# Patient Record
Sex: Male | Born: 1955
Health system: Southern US, Community
[De-identification: ages and names within clinical notes are randomized; demographics above are authoritative.]

## PROBLEM LIST (undated history)

## (undated) DIAGNOSIS — R011 Cardiac murmur, unspecified: Secondary | ICD-10-CM

## (undated) DIAGNOSIS — E785 Hyperlipidemia, unspecified: Secondary | ICD-10-CM

## (undated) DIAGNOSIS — M1990 Unspecified osteoarthritis, unspecified site: Secondary | ICD-10-CM

## (undated) DIAGNOSIS — I10 Essential (primary) hypertension: Secondary | ICD-10-CM

## (undated) HISTORY — PX: KNEE ARTHROSCOPY W/ MENISCAL REPAIR: SHX1877

## (undated) HISTORY — DX: Cardiac murmur, unspecified: R01.1

## (undated) HISTORY — DX: Unspecified osteoarthritis, unspecified site: M19.90

## (undated) HISTORY — DX: Hyperlipidemia, unspecified: E78.5

## (undated) HISTORY — DX: Essential (primary) hypertension: I10

## (undated) HISTORY — PX: COLONOSCOPY: SHX174

---

## 2013-02-09 ENCOUNTER — Ambulatory Visit (INDEPENDENT_AMBULATORY_CARE_PROVIDER_SITE_OTHER): Payer: 59 | Admitting: Family Medicine

## 2013-02-09 ENCOUNTER — Ambulatory Visit: Payer: 59

## 2013-02-09 VITALS — BP 145/89 | HR 67 | Temp 97.9°F | Resp 16 | Ht 72.0 in | Wt 177.0 lb

## 2013-02-09 DIAGNOSIS — G8929 Other chronic pain: Secondary | ICD-10-CM

## 2013-02-09 DIAGNOSIS — M1610 Unilateral primary osteoarthritis, unspecified hip: Secondary | ICD-10-CM

## 2013-02-09 DIAGNOSIS — M169 Osteoarthritis of hip, unspecified: Secondary | ICD-10-CM

## 2013-02-09 DIAGNOSIS — M25559 Pain in unspecified hip: Secondary | ICD-10-CM

## 2013-02-09 MED ORDER — NABUMETONE 750 MG PO TABS: 750.0000 mg | ORAL_TABLET | Freq: Two times a day (BID) | ORAL | Status: DC

## 2013-02-09 NOTE — Patient Instructions (Addendum)
Referral will be made to the orthopedist.  Take Relafen one twice daily
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
## 2013-02-17 ENCOUNTER — Telehealth: Payer: Self-pay

## 2013-02-17 NOTE — Telephone Encounter (Signed)
Dr Alwyn Ren had received a request for surgical clearance from [facility] and asked me to contact pt to advise him to RTC for eval. I called pt and he asked me to speak to wife. Advised wife of need to RTC to eval for safety of undergoing surgery and gave her Dr Hopper's hours. She stated they had attempted to come today but got here only an hour before he left and he was too busy to see pt before hours were up. She agreed that they will RTC again.

## 2013-03-10 ENCOUNTER — Telehealth: Payer: Self-pay

## 2013-03-10 NOTE — Telephone Encounter (Signed)
Patient dropped off medical release form yesterday and was told it would be done within 24 hours.  He is upset as it is not done yet.   CBN:  [phone]

## 2013-03-22 ENCOUNTER — Encounter (HOSPITAL_COMMUNITY): Admission: RE | Payer: Self-pay | Source: Ambulatory Visit

## 2013-03-22 ENCOUNTER — Inpatient Hospital Stay (HOSPITAL_COMMUNITY): Admission: RE | Admit: 2013-03-22 | Payer: Self-pay | Source: Ambulatory Visit | Admitting: Orthopedic Surgery

## 2013-03-22 SURGERY — ARTHROPLASTY, HIP, TOTAL,POSTERIOR APPROACH
Anesthesia: General | Laterality: Right

## 2014-11-20 ENCOUNTER — Ambulatory Visit (INDEPENDENT_AMBULATORY_CARE_PROVIDER_SITE_OTHER): Payer: 59 | Admitting: Internal Medicine

## 2014-11-20 VITALS — BP 159/100 | HR 61 | Temp 98.2°F | Resp 18 | Wt 179.0 lb

## 2014-11-20 DIAGNOSIS — H9201 Otalgia, right ear: Secondary | ICD-10-CM

## 2014-11-20 DIAGNOSIS — J3489 Other specified disorders of nose and nasal sinuses: Secondary | ICD-10-CM

## 2014-11-20 DIAGNOSIS — R0989 Other specified symptoms and signs involving the circulatory and respiratory systems: Secondary | ICD-10-CM

## 2014-11-20 MED ORDER — NEOMYCIN-POLYMYXIN-HC 3.5-10000-1 OT SOLN: 4.0000 [drp] | Freq: Three times a day (TID) | OTIC | Status: DC

## 2014-11-20 NOTE — Progress Notes (Signed)
   Subjective:    Patient ID: Travis Trevino, male    DOB: 1956-09-29, 58 y.o.   MRN: [id number]  HPI Painful right ear Minor nasal congestion, no fever, no sweats.   Review of Systems     Objective:   Physical Exam  Constitutional: He is oriented to person, place, and time. He appears well-developed and well-nourished. No distress.  HENT:  Head: Normocephalic.  Right Ear: External ear normal.  Left Ear: External ear normal.  Nose: Nose normal.  Mouth/Throat: Oropharynx is clear and moist.  Eyes: Conjunctivae and EOM are normal. Pupils are equal, round, and reactive to light.  Neck: Normal range of motion. Neck supple.  Musculoskeletal: Normal range of motion.  Lymphadenopathy:       Head (right side): Posterior auricular adenopathy present.    He has cervical adenopathy.       Right cervical: Superficial cervical adenopathy present.  Neurological: He is alert and oriented to person, place, and time. He exhibits normal muscle tone. Coordination normal.  Skin: No rash noted.  Psychiatric: He has a normal mood and affect.  Vitals reviewed.         Assessment & Plan:  Otitis externa

## 2014-11-20 NOTE — Patient Instructions (Signed)

## 2014-12-02 ENCOUNTER — Telehealth: Payer: Self-pay

## 2014-12-02 NOTE — Telephone Encounter (Signed)
Pt was prescribed cortisporin and is now experiencing green drainage. They would like an antibiotic for a sinus infection.

## 2014-12-04 NOTE — Telephone Encounter (Signed)
No answer on home phone- Pt needs to RTC

## 2014-12-05 NOTE — Telephone Encounter (Signed)
Lm on cell to RTC

## 2015-07-28 ENCOUNTER — Ambulatory Visit (INDEPENDENT_AMBULATORY_CARE_PROVIDER_SITE_OTHER): Payer: 59 | Admitting: Emergency Medicine

## 2015-07-28 VITALS — BP 130/84 | HR 71 | Temp 98.2°F | Resp 16 | Ht 71.5 in | Wt 175.6 lb

## 2015-07-28 DIAGNOSIS — L237 Allergic contact dermatitis due to plants, except food: Secondary | ICD-10-CM

## 2015-07-28 DIAGNOSIS — L03119 Cellulitis of unspecified part of limb: Secondary | ICD-10-CM | POA: Diagnosis not present

## 2015-07-28 MED ORDER — HYDROXYZINE HCL 25 MG PO TABS: 25.0000 mg | ORAL_TABLET | Freq: Four times a day (QID) | ORAL | Status: DC | PRN

## 2015-07-28 MED ORDER — SULFAMETHOXAZOLE-TRIMETHOPRIM 800-160 MG PO TABS: 1.0000 | ORAL_TABLET | Freq: Two times a day (BID) | ORAL | Status: DC

## 2015-07-28 MED ORDER — TRIAMCINOLONE ACETONIDE 0.1 % EX CREA: 1.0000 "application " | TOPICAL_CREAM | Freq: Two times a day (BID) | CUTANEOUS | Status: DC

## 2015-07-28 NOTE — Patient Instructions (Signed)

## 2015-07-28 NOTE — Progress Notes (Signed)
Subjective:  Patient ID: Travis Trevino, male    DOB: 05-28-1956  Age: 59 y.o. MRN: [id number]  CC: Rash   HPI HAROUN COTHAM presents  with a rash on both arms. Worse on the left. He said this rash developed after lifting some logs at work. He said no improvement in the itching with Benadryl he said that it keeps him awake at night he is concerned that he might have cellulitis involving left arm. He has no fever chills no drainage from the lesions he's had no improvement with over-the-counter medication History Nain has a past medical history of Arthritis and Heart murmur.   He has no past surgical history on file.   His  family history includes Cancer in his mother; Heart disease in his father.  He   reports that he has been smoking.  He does not have any smokeless tobacco history on file. He reports that he drinks alcohol. His drug history is not on file.  Outpatient Prescriptions Prior to Visit  Medication Sig Dispense Refill  . neomycin-polymyxin-hydrocortisone (CORTISPORIN) otic solution Place 4 drops into both ears 3 (three) times daily. 10 mL 3   No facility-administered medications prior to visit.    Social History   Social History  . Marital Status: Married    Spouse Name: N/A  . Number of Children: N/A  . Years of Education: N/A   Social History Main Topics  . Smoking status: Current Some Day Smoker  . Smokeless tobacco: None  . Alcohol Use: Yes     Comment: social  . Drug Use: None  . Sexual Activity: Not Asked   Other Topics Concern  . None   Social History Narrative     Review of Systems  Constitutional: Negative for fever, chills and appetite change.  HENT: Negative for congestion, ear pain, postnasal drip, sinus pressure and sore throat.   Eyes: Negative for pain and redness.  Respiratory: Negative for cough, shortness of breath and wheezing.   Cardiovascular: Negative for leg swelling.  Gastrointestinal: Negative for nausea, vomiting,  abdominal pain, diarrhea, constipation and blood in stool.  Endocrine: Negative for polyuria.  Genitourinary: Negative for dysuria, urgency, frequency and flank pain.  Musculoskeletal: Negative for gait problem.  Skin: Negative for rash.  Neurological: Negative for weakness and headaches.  Psychiatric/Behavioral: Negative for confusion and decreased concentration. The patient is not nervous/anxious.     Objective:  BP 130/84 mmHg  Pulse 71  Temp(Src) 98.2 F (36.8 C) (Oral)  Resp 16  Ht 5' 11.5" (1.816 m)  Wt 175 lb 9.6 oz (79.652 kg)  BMI 24.15 kg/m2  SpO2 98%  Physical Exam  Constitutional: He is oriented to person, place, and time. He appears well-developed and well-nourished.  HENT:  Head: Normocephalic and atraumatic.  Eyes: Conjunctivae are normal. Pupils are equal, round, and reactive to light.  Pulmonary/Chest: Effort normal.  Musculoskeletal: He exhibits no edema.  Neurological: He is alert and oriented to person, place, and time.  Skin: Skin is dry. Rash noted.  Psychiatric: He has a normal mood and affect. His behavior is normal. Thought content normal.   History generalized rash on his left forearm and less on his right forearm with scattered lesions on his lower extremities. He has swelling and redness in and warmth in the left arm suggestive of cellulitis but certainly not clear-cut   Assessment & Plan:   Nicholos was seen today for rash.  Diagnoses and all orders for this visit:  Poison  ivy  Cellulitis of upper extremity, unspecified laterality  Other orders -     triamcinolone cream (KENALOG) 0.1 %; Apply 1 application topically 2 (two) times daily. -     sulfamethoxazole-trimethoprim (BACTRIM DS,SEPTRA DS) 800-160 MG per tablet; Take 1 tablet by mouth 2 (two) times daily. -     hydrOXYzine (ATARAX/VISTARIL) 25 MG tablet; Take 1 tablet (25 mg total) by mouth every 6 (six) hours as needed for itching.   I have discontinued Mr. Shrewsberry  neomycin-polymyxin-hydrocortisone. I am also having him start on triamcinolone cream, sulfamethoxazole-trimethoprim, and hydrOXYzine.  Meds ordered this encounter  Medications  . triamcinolone cream (KENALOG) 0.1 %    Sig: Apply 1 application topically 2 (two) times daily.    Dispense:  30 g    Refill:  0  . sulfamethoxazole-trimethoprim (BACTRIM DS,SEPTRA DS) 800-160 MG per tablet    Sig: Take 1 tablet by mouth 2 (two) times daily.    Dispense:  20 tablet    Refill:  0  . hydrOXYzine (ATARAX/VISTARIL) 25 MG tablet    Sig: Take 1 tablet (25 mg total) by mouth every 6 (six) hours as needed for itching.    Dispense:  30 tablet    Refill:  0    Appropriate red flag conditions were discussed with the patient as well as actions that should be taken.  Patient expressed his understanding.  Follow-up: Return if symptoms worsen or fail to improve.  Carmelina Dane, MD

## 2015-09-08 ENCOUNTER — Ambulatory Visit (INDEPENDENT_AMBULATORY_CARE_PROVIDER_SITE_OTHER): Payer: 59 | Admitting: Emergency Medicine

## 2015-09-08 VITALS — BP 140/96 | HR 75 | Temp 97.9°F | Resp 17 | Ht 71.0 in | Wt 179.0 lb

## 2015-09-08 DIAGNOSIS — G4700 Insomnia, unspecified: Secondary | ICD-10-CM

## 2015-09-08 MED ORDER — TEMAZEPAM 30 MG PO CAPS: 30.0000 mg | ORAL_CAPSULE | Freq: Every evening | ORAL | Status: DC | PRN

## 2015-09-08 NOTE — Patient Instructions (Signed)
Insomnia Insomnia is frequent trouble falling and/or staying asleep. Insomnia can be a long term problem or a short term problem. Both are common. Insomnia can be a short term problem when the wakefulness is related to a certain stress or worry. Long term insomnia is often related to ongoing stress during waking hours and/or poor sleeping habits. Overtime, sleep deprivation itself can make the problem worse. Every little thing feels more severe because you are overtired and your ability to cope is decreased. CAUSES   Stress, anxiety, and depression.  Poor sleeping habits.  Distractions such as TV in the bedroom.  Naps close to bedtime.  Engaging in emotionally charged conversations before bed.  Technical reading before sleep.  Alcohol and other sedatives. They may make the problem worse. They can hurt normal sleep patterns and normal dream activity.  Stimulants such as caffeine for several hours prior to bedtime.  Pain syndromes and shortness of breath can cause insomnia.  Exercise late at night.  Changing time zones may cause sleeping problems (jet lag). It is sometimes helpful to have someone observe your sleeping patterns. They should look for periods of not breathing during the night (sleep apnea). They should also look to see how long those periods last. If you live alone or observers are uncertain, you can also be observed at a sleep clinic where your sleep patterns will be professionally monitored. Sleep apnea requires a checkup and treatment. Give your caregivers your medical history. Give your caregivers observations your family has made about your sleep.  SYMPTOMS   Not feeling rested in the morning.  Anxiety and restlessness at bedtime.  Difficulty falling and staying asleep. TREATMENT   Your caregiver may prescribe treatment for an underlying medical disorders. Your caregiver can give advice or help if you are using alcohol or other drugs for self-medication. Treatment  of underlying problems will usually eliminate insomnia problems.  Medications can be prescribed for short time use. They are generally not recommended for lengthy use.  Over-the-counter sleep medicines are not recommended for lengthy use. They can be habit forming.  You can promote easier sleeping by making lifestyle changes such as:  Using relaxation techniques that help with breathing and reduce muscle tension.  Exercising earlier in the day.  Changing your diet and the time of your last meal. No night time snacks.  Establish a regular time to go to bed.  Counseling can help with stressful problems and worry.  Soothing music and white noise may be helpful if there are background noises you cannot remove.  Stop tedious detailed work at least one hour before bedtime. HOME CARE INSTRUCTIONS   Keep a diary. Inform your caregiver about your progress. This includes any medication side effects. See your caregiver regularly. Take note of:  Times when you are asleep.  Times when you are awake during the night.  The quality of your sleep.  How you feel the next day. This information will help your caregiver care for you.  Get out of bed if you are still awake after 15 minutes. Read or do some quiet activity. Keep the lights down. Wait until you feel sleepy and go back to bed.  Keep regular sleeping and waking hours. Avoid naps.  Exercise regularly.  Avoid distractions at bedtime. Distractions include watching television or engaging in any intense or detailed activity like attempting to balance the household checkbook.  Develop a bedtime ritual. Keep a familiar routine of bathing, brushing your teeth, climbing into bed at the same   time each night, listening to soothing music. Routines increase the success of falling to sleep faster.  Use relaxation techniques. This can be using breathing and muscle tension release routines. It can also include visualizing peaceful scenes. You can  also help control troubling or intruding thoughts by keeping your mind occupied with boring or repetitive thoughts like the old concept of counting sheep. You can make it more creative like imagining planting one beautiful flower after another in your backyard garden.  During your day, work to eliminate stress. When this is not possible use some of the previous suggestions to help reduce the anxiety that accompanies stressful situations. MAKE SURE YOU:   Understand these instructions.  Will watch your condition.  Will get help right away if you are not doing well or get worse. Document Released: 11/28/2000 Document Revised: 02/23/2012 Document Reviewed: 12/29/2007 Exit[employer] Patient Information 2015 [employer]. This information is not intended to replace advice given to you by your health care provider. Make sure you discuss any questions you have with your health care provider.  

## 2015-09-08 NOTE — Progress Notes (Signed)
Subjective:  Patient ID: Travis Trevino, male    DOB: 02/05/1956  Age: 59 y.o. MRN: [id number]  CC: Insomnia   HPI Travis Trevino presents   With a difficulty sleeping. His wife brought him in saying that he's been underlying having difficulty remaining  Asleep , awakening at 3:00 in the morning. He somewhat anxious related to some work pressures. He has 3 separate jobs full-time job and 2 part-time jobs so he is little opportunity to be at rest. This is just become a problem over the last  Several weeks.  History Travis Trevino has a past medical history of Arthritis and Heart murmur.   He has no past surgical history on file.   His  family history includes Cancer in his mother; Heart disease in his father.  He   reports that he has been smoking.  He does not have any smokeless tobacco history on file. He reports that he drinks alcohol. His drug history is not on file.  Outpatient Prescriptions Prior to Visit  Medication Sig Dispense Refill  . hydrOXYzine (ATARAX/VISTARIL) 25 MG tablet Take 1 tablet (25 mg total) by mouth every 6 (six) hours as needed for itching. 30 tablet 0  . triamcinolone cream (KENALOG) 0.1 % Apply 1 application topically 2 (two) times daily. 30 g 0  . sulfamethoxazole-trimethoprim (BACTRIM DS,SEPTRA DS) 800-160 MG per tablet Take 1 tablet by mouth 2 (two) times daily. (Patient not taking: Reported on 09/08/2015) 20 tablet 0   No facility-administered medications prior to visit.    Social History   Social History  . Marital Status: Married    Spouse Name: N/A  . Number of Children: N/A  . Years of Education: N/A   Social History Main Topics  . Smoking status: Current Some Day Smoker  . Smokeless tobacco: None  . Alcohol Use: Yes     Comment: social  . Drug Use: None  . Sexual Activity: Not Asked   Other Topics Concern  . None   Social History Narrative     Review of Systems  Constitutional: Negative for fever, chills and appetite change.    HENT: Negative for congestion, ear pain, postnasal drip, sinus pressure and sore throat.   Eyes: Negative for pain and redness.  Respiratory: Negative for cough, shortness of breath and wheezing.   Cardiovascular: Negative for leg swelling.  Gastrointestinal: Negative for nausea, vomiting, abdominal pain, diarrhea, constipation and blood in stool.  Endocrine: Negative for polyuria.  Genitourinary: Negative for dysuria, urgency, frequency and flank pain.  Musculoskeletal: Negative for gait problem.  Skin: Negative for rash.  Neurological: Negative for weakness and headaches.  Psychiatric/Behavioral: Positive for sleep disturbance. Negative for confusion and decreased concentration. The patient is not nervous/anxious.     Objective:  BP 140/96 mmHg  Pulse 75  Temp(Src) 97.9 F (36.6 C) (Oral)  Resp 17  Ht  (1.803 m)  Wt 179 lb (81.194 kg)  BMI 24.98 kg/m2  SpO2 98%  Physical Exam  Constitutional: He is oriented to person, place, and time. He appears well-developed and well-nourished.  HENT:  Head: Normocephalic and atraumatic.  Eyes: Conjunctivae are normal. Pupils are equal, round, and reactive to light.  Pulmonary/Chest: Effort normal.  Musculoskeletal: He exhibits no edema.  Neurological: He is alert and oriented to person, place, and time.  Skin: Skin is dry.  Psychiatric: His speech is normal and behavior is normal. Judgment and thought content normal. His mood appears anxious. Cognition and memory are normal.  Assessment & Plan:   Celestine was seen today for insomnia.  Diagnoses and all orders for this visit:  Insomnia  Other orders -     temazepam (RESTORIL) 30 MG capsule; Take 1 capsule (30 mg total) by mouth at bedtime as needed for sleep.   I am having Travis Trevino start on temazepam. I am also having him maintain his triamcinolone cream, sulfamethoxazole-trimethoprim, and hydrOXYzine.  Meds ordered this encounter  Medications  . temazepam  (RESTORIL) 30 MG capsule    Sig: Take 1 capsule (30 mg total) by mouth at bedtime as needed for sleep.    Dispense:  30 capsule    Refill:  5    Appropriate red flag conditions were discussed with the patient as well as actions that should be taken.  Patient expressed his understanding.  Follow-up: Return if symptoms worsen or fail to improve.  Carmelina Dane, MD

## 2016-02-06 ENCOUNTER — Other Ambulatory Visit: Payer: Self-pay | Admitting: Nurse Practitioner

## 2016-02-06 ENCOUNTER — Ambulatory Visit
Admission: RE | Admit: 2016-02-06 | Discharge: 2016-02-06 | Disposition: A | Discharge status: Home / Self Care | Payer: Worker's Compensation | Source: Ambulatory Visit | Attending: Nurse Practitioner | Admitting: Nurse Practitioner

## 2016-02-06 DIAGNOSIS — R52 Pain, unspecified: Secondary | ICD-10-CM

## 2016-05-07 ENCOUNTER — Ambulatory Visit (INDEPENDENT_AMBULATORY_CARE_PROVIDER_SITE_OTHER): Payer: Worker's Compensation | Admitting: Urgent Care

## 2016-05-07 VITALS — BP 162/90 | HR 75 | Temp 97.9°F | Resp 18 | Ht 71.0 in | Wt 177.0 lb

## 2016-05-07 DIAGNOSIS — R030 Elevated blood-pressure reading, without diagnosis of hypertension: Secondary | ICD-10-CM | POA: Diagnosis not present

## 2016-05-07 DIAGNOSIS — S83207A Unspecified tear of unspecified meniscus, current injury, left knee, initial encounter: Secondary | ICD-10-CM | POA: Diagnosis not present

## 2016-05-07 NOTE — Patient Instructions (Addendum)
Meniscus Injury, Arthroscopy Arthroscopy is a surgical procedure that involves the use of a small scope that has a camera and surgical instruments on the end (arthroscope). An arthroscope can be used to repair your meniscus injury.  LET [facility] CARE PROVIDER KNOW ABOUT:  Any allergies you have.  All medicines you are taking, including vitamins, herbs, eyedrops, creams, and over-the-counter medicines.  Any recent colds or infections you have had or currently have.  Previous problems you or members of your family have had with the use of anesthetics.  Any blood disorders or blood clotting problems you have.  Previous surgeries you have had.  Medical conditions you have. RISKS AND COMPLICATIONS Generally, this is a safe procedure. However, as with any procedure, problems can occur. Possible problems include:  Damage to nerves or blood vessels.  Excess bleeding.  Blood clots.  Infection. BEFORE THE PROCEDURE  Do not eat or drink for 6-8 hours before the procedure.  Take medicines as directed by your surgeon. Ask your surgeon about changing or stopping your regular medicines.  You may have lab tests the morning of surgery. PROCEDURE  You will be given one of the following:   A medicine that numbs the area (local anesthesia).  A medicine that makes you go to sleep (general anesthesia).  A medicine injected into your spine that numbs your body below the waist (spinal anesthesia). Most often, several small cuts (incisions) are made in the knee. The arthroscope and instruments go into the incisions to repair the damage. The torn portion of the meniscus is removed.  During this time, your surgeon may find a partial or complete tear in a cruciate ligament, such as the anterior cruciate ligament (ACL). A completely torn cruciate ligament is reconstructed by taking tissue from another part of the body (grafting) and placing it into the injured area. This requires several larger  incisions to complete the repair. Sometimes, open surgery is needed for collateral ligament injuries. If a collateral ligament is found to be injured, your surgeon may staple or suture the tear through a slightly larger incision on the side of the knee. AFTER THE PROCEDURE You will be taken to the recovery area where your progress will be monitored. When you are awake, stable, and taking fluids without complications, you will be allowed to go home. This is usually the same day. However, more extensive repairs of a ligament may require an overnight stay.  The recovery time after repairing your meniscus or ligament depends on the amount of damage to these structures. It also depends on whether or not reconstructive knee surgery was needed.   A torn or stretched ligament (ligament sprain) may take 6-8 weeks to heal. It takes about the same amount of time if your surgeon removed a torn meniscus.  A repaired meniscus may require 6-12 weeks of recovery time.  A torn ligament needing reconstructive surgery may take 6-12 months to heal fully.   This information is not intended to replace advice given to you by your health care provider. Make sure you discuss any questions you have with your health care provider.   Document Released: 11/28/2000 Document Revised: 12/06/2013 Document Reviewed: 04/29/2013 Elsevier Interactive Patient Education 2016 [employer].     IF you received an x-ray today, you will receive an invoice from [facility]. Please contact [facility] at [phone] with questions or concerns regarding your invoice.   IF you received labwork today, you will receive an invoice from [employer]. Please  contact [facility] at [phone] with questions or concerns regarding your invoice.   Our billing staff will not be able to assist you with questions regarding bills from these companies.  You will be contacted with the lab results as soon as  they are available. The fastest way to get your results is to activate your My Chart account. Instructions are located on the last page of this paperwork. If you have not heard from us regarding the results in 2 weeks, please contact this office.

## 2016-05-07 NOTE — Progress Notes (Signed)
    MRN: [id number] DOB: 17-Jan-1956  Subjective:   Travis Trevino is a 60 y.o. male [facility]  Reports that he needs clearance for surgery of his left knee, needs his meniscus repaired. He suffered an injury while at work, has completed physical therapy, also had evaluation with [facility]. He underwent MRI as ordered by Dr. Ranell PatrickNorris who then recommended arthroscopic surgery for a torn meniscus. Patient eats healthily, exercises regularly. Admits history of a heart murmur heard on physical exam with the [employer]. He has never had a diagnoses associated with it. Denies history of heart disease, hypertension, hyperlipidemia, arrhythmias. Denies lightheadedness, dizziness, chronic headache, double vision, chest pain, shortness of breath, heart racing, palpitations, nausea, vomiting, abdominal pain. Quit smoking 2016, used to smoke 1 pack per month. Drinks alcohol very occasionally.  Pawel's medications list, allergies, past medical history, past surgical history and family history were reviewed and excluded from this note due to being a worker's comp case.  Objective:   Vitals: BP 148/100 mmHg  Pulse 75  Temp(Src) 97.9 F (36.6 C) (Oral)  Resp 18  Ht 5\' 11"  (1.803 m)  Wt 177 lb (80.287 kg)  BMI 24.70 kg/m2  SpO2 100%  Physical Exam  Constitutional: He is oriented to person, place, and time. He appears well-developed and well-nourished.  HENT:  Mouth/Throat: Oropharynx is clear and moist.  Eyes: Pupils are equal, round, and reactive to light. No scleral icterus.  Neck: Normal range of motion. Neck supple.  Cardiovascular: Normal rate, regular rhythm and intact distal pulses.  Exam reveals no gallop and no friction rub.   No murmur heard. Pulmonary/Chest: No respiratory distress. He has no wheezes. He has no rales.  Abdominal: Soft. Bowel sounds are normal. He exhibits no distension and no mass. There is no tenderness.  Musculoskeletal: He exhibits  no edema.  Neurological: He is alert and oriented to person, place, and time.  Skin: Skin is warm and dry.  Psychiatric: He has a normal mood and affect.   ECG interpretation - normal sinus rhythm.  Assessment and Plan :   1. Meniscus tear, left, initial encounter - Surgical clearance provided.   2. Elevated blood pressure reading without diagnosis of hypertension - Recommended patient purchase blood pressure cuff and monitor BP at home. His wife is a Astronomerclinical research coordinator and states that his blood pressure normally runs low but that he has white coat syndrome. Will continue to monitor.  Wallis BambergMario Manual Navarra, PA-C [facility] [phone] 05/07/2016 8:40 AM

## 2016-11-15 ENCOUNTER — Ambulatory Visit (INDEPENDENT_AMBULATORY_CARE_PROVIDER_SITE_OTHER): Payer: 59 | Admitting: Emergency Medicine

## 2016-11-15 VITALS — BP 136/82 | HR 72 | Temp 97.8°F | Resp 16 | Ht 71.0 in | Wt 179.6 lb

## 2016-11-15 DIAGNOSIS — G2581 Restless legs syndrome: Secondary | ICD-10-CM | POA: Diagnosis not present

## 2016-11-15 LAB — COMPREHENSIVE METABOLIC PANEL
ALT: 18 U/L (ref 9–46)
AST: 19 U/L (ref 10–35)
Albumin: 3.8 g/dL (ref 3.6–5.1)
Alkaline Phosphatase: 67 U/L (ref 40–115)
BILIRUBIN TOTAL: 0.5 mg/dL (ref 0.2–1.2)
BUN: 7 mg/dL (ref 7–25)
CALCIUM: 8.7 mg/dL (ref 8.6–10.3)
CHLORIDE: 107 mmol/L (ref 98–110)
CO2: 21 mmol/L (ref 20–31)
Creat: 1.08 mg/dL (ref 0.70–1.25)
GLUCOSE: 112 mg/dL — AB (ref 65–99)
POTASSIUM: 3.7 mmol/L (ref 3.5–5.3)
Sodium: 140 mmol/L (ref 135–146)
Total Protein: 6.8 g/dL (ref 6.1–8.1)

## 2016-11-15 LAB — POCT CBC
GRANULOCYTE PERCENT: 61.6 % (ref 37–80)
HCT, POC: 38.1 % — AB (ref 43.5–53.7)
Hemoglobin: 13.4 g/dL — AB (ref 14.1–18.1)
Lymph, poc: 2.2 (ref 0.6–3.4)
MCH: 31.9 pg — AB (ref 27–31.2)
MCHC: 35.2 g/dL (ref 31.8–35.4)
MCV: 90.6 fL (ref 80–97)
MID (CBC): 0.3 (ref 0–0.9)
MPV: 6.6 fL (ref 0–99.8)
PLATELET COUNT, POC: 402 10*3/uL (ref 142–424)
POC GRANULOCYTE: 4.1 (ref 2–6.9)
POC LYMPH PERCENT: 33.3 %L (ref 10–50)
POC MID %: 5.1 %M (ref 0–12)
RBC: 4.2 M/uL — AB (ref 4.69–6.13)
RDW, POC: 13.1 %
WBC: 6.7 10*3/uL (ref 4.6–10.2)

## 2016-11-15 LAB — CK: Total CK: 206 U/L (ref 7–232)

## 2016-11-15 MED ORDER — GABAPENTIN 100 MG PO CAPS: ORAL_CAPSULE | ORAL | 3 refills | Status: DC

## 2016-11-15 NOTE — Progress Notes (Signed)
By signing my name below, I, Travis Trevino, attest that this documentation has been prepared under the direction and in the presence of Travis ChrisSteven Marthella Osorno, MD.  Electronically Signed: Andrew Auaven Trevino, ED Scribe. 11/15/2016. 8:22 AM.  Chief Complaint:  Chief Complaint  Patient presents with  . Leg Pain    constant moving, tingling sensation   HPI: Travis Trevino is a 60 y.o. male [facility] complaining of bilateral leg pain for the past 5 years. Pt reports leg soreness, throbbing, twitching and shooting pains when trying to sleep at night. His significant other reports pt constantly moving around, changing position and jerk at night. Reports working long hours for the [employer]. He finds that diverting his mind off leg twitching helps with sleep such as listening to music, playing guitar and watching TV. He has hx of knee arthroscopy with meniscal repair 06/2016.   Past Medical History:  Diagnosis Date  . Arthritis   . Heart murmur    Past Surgical History:  Procedure Laterality Date  . KNEE ARTHROSCOPY W/ MENISCAL REPAIR Left    06/2016   Social History   Social History  . Marital status: Married    Spouse name: N/A  . Number of children: N/A  . Years of education: N/A   Social History Main Topics  . Smoking status: Current Some Day Smoker  . Smokeless tobacco: Never Used  . Alcohol use Yes     Comment: social  . Drug use: Unknown  . Sexual activity: Not Asked   Other Topics Concern  . None   Social History Narrative  . None   Family History  Problem Relation Age of Onset  . Cancer Maternal Aunt    No Known Allergies Prior to Admission medications   Not on File    ROS: The patient denies fevers, chills, night sweats, unintentional weight loss, chest pain, palpitations, wheezing, dyspnea on exertion, nausea, vomiting, abdominal pain, dysuria, hematuria, melena, numbness, weakness, or tingling.  All other systems have been reviewed and were otherwise negative  with the exception of those mentioned in the HPI and as above.    PHYSICAL EXAM: Vitals:   11/15/16 0818  BP: 136/82  Pulse: 72  Resp: 16  Temp: 97.8 F (36.6 C)   Body mass index is 25.05 kg/m.   General: Alert, no acute distress HEENT:  Normocephalic, atraumatic, oropharynx patent. Eye: Nonie HoyerOMI, [facility] Cardiovascular:  Regular rate and rhythm, no rubs murmurs or gallops.  No Carotid bruits, radial pulse intact. No pedal edema. 2+ pulses Respiratory: Clear to auscultation bilaterally.  No wheezes, rales, or rhonchi.  No cyanosis, no use of accessory musculature Abdominal: No organomegaly, abdomen is soft and non-tender, positive bowel sounds.  No masses. Musculoskeletal: Gait intact. No edema, tenderness. 5/5 strength. Skin: No rashes. Multiple scars around left knee.  Neurologic: Facial musculature symmetric. Absent left ankle reflex.  Psychiatric: Patient acts appropriately throughout our interaction. Lymphatic: No cervical or submandibular lymphadenopathy    LABS: Results for orders placed or performed in visit on 11/15/16  POCT CBC  Result Value Ref Range   WBC 6.7 4.6 - 10.2 K/uL   Lymph, poc 2.2 0.6 - 3.4   POC LYMPH PERCENT 33.3 10 - 50 %L   MID (cbc) 0.3 0 - 0.9   POC MID % 5.1 0 - 12 %M   POC Granulocyte 4.1 2 - 6.9   Granulocyte percent 61.6 37 - 80 %G   RBC 4.20 (A) 4.69 -  6.13 M/uL   Hemoglobin 13.4 (A) 14.1 - 18.1 g/dL   HCT, POC 13.238.1 (A) 44.043.5 - 53.7 %   MCV 90.6 80 - 97 fL   MCH, POC 31.9 (A) 27 - 31.2 pg   MCHC 35.2 31.8 - 35.4 g/dL   RDW, POC 10.213.1 %   Platelet Count, POC 402 142 - 424 K/uL   MPV 6.6 0 - 99.8 fL     EKG/XRAY:   Primary [facility]   ASSESSMENT/PLAN: Hemoglobin is slightly low which may be secondary to recent surgery. Advised him to take a multivitamin with iron one a day. We'll start gabapentin 100 mg and slowly increased to a nighttime dose of 300 mg. Referral made to neurology because on examination he  did not have a reflex in his left ankle. I advised him to make a follow-up appointment to see Dr. Neva Trevino for continued care.   Gross sideeffects, risk and benefits, and alternatives of medications d/w patient. Patient is aware that all medications have potential sideeffects and we are unable to predict every sideeffect or drug-drug interaction that may occur.  Travis ChrisSteven Ladarius Seubert MD 11/15/2016 8:22 AM

## 2016-11-15 NOTE — Patient Instructions (Addendum)
IF you received an x-ray today, you will receive an invoice from [facility]. Please contact [facility] at [phone] with questions or concerns regarding your invoice.   IF you received labwork today, you will receive an invoice from [facility]. Please contact [facility] at [phone] with questions or concerns regarding your invoice.   Our billing staff will not be able to assist you with questions regarding bills from these companies.  You will be contacted with the lab results as soon as they are available. The fastest way to get your results is to activate your My Chart account. Instructions are located on the last page of this paperwork. If you have not heard from us regarding the results in 2 weeks, please contact this office.     Restless Legs Syndrome Introduction Restless legs syndrome is a condition that causes uncomfortable feelings or sensations in the legs, especially while sitting or lying down. The sensations usually cause an overwhelming urge to move the legs. The arms can also sometimes be affected. The condition can range from mild to severe. The symptoms often interfere with a person's ability to sleep. What are the causes? The cause of this condition is not known. What increases the risk? This condition is more likely to develop in:  People who are older than age 61.  Pregnant women. In general, restless legs syndrome is more common in women than in men.  People who have a family history of the condition.  People who have certain medical conditions, such as iron deficiency, kidney disease, Parkinson disease, or nerve damage.  People who take certain medicines, such as medicines for high blood pressure, nausea, colds, allergies, depression, and some heart conditions. What are the signs or symptoms? The main symptom of this condition is uncomfortable sensations in the legs. These sensations may be:  Described as  pulling, tingling, prickling, throbbing, crawling, or burning.  Worse while you are sitting or lying down.  Worse during periods of rest or inactivity.  Worse at night, often interfering with your sleep.  Accompanied by a very strong urge to move your legs.  Temporarily relieved by movement of your legs. The sensations usually affect both sides of the body. The arms can also be affected, but this is rare. People who have this condition often have tiredness during the day because of their lack of sleep at night. How is this diagnosed? This condition may be diagnosed based on your description of the symptoms. You may also have tests, including blood tests, to check for other conditions that may lead to your symptoms. In some cases, you may be asked to spend some time in a sleep lab so your sleeping can be monitored. How is this treated? Treatment for this condition is focused on managing the symptoms. Treatment may include:  Self-help and lifestyle changes.  Medicines. Follow these instructions at home:  Take medicines only as directed by your health care provider.  Try these methods to get temporary relief from the uncomfortable sensations:  Massage your legs.  Walk or stretch.  Take a cold or hot bath.  Practice good sleep habits. For example, go to bed and get up at the same time every day.  Exercise regularly.  Practice ways of relaxing, such as yoga or meditation.  Avoid caffeine and alcohol.  Do not use any tobacco products, including cigarettes, chewing tobacco, or electronic cigarettes. If you need help quitting, ask your health care provider.  Keep all follow-up  visits as directed by your health care provider. This is important. Contact a health care provider if: Your symptoms do not improve with treatment, or they get worse. This information is not intended to replace advice given to you by your health care provider. Make sure you discuss any questions you have  with your health care provider. Document Released: 11/21/2002 Document Revised: 05/08/2016 Document Reviewed: 11/27/2014  2017 Elsevier

## 2016-11-16 LAB — FERRITIN: Ferritin: 39 ng/mL (ref 20–380)

## 2017-01-29 ENCOUNTER — Ambulatory Visit: Payer: Worker's Compensation | Admitting: Neurology

## 2017-02-18 ENCOUNTER — Ambulatory Visit (INDEPENDENT_AMBULATORY_CARE_PROVIDER_SITE_OTHER): Payer: 59 | Admitting: Urgent Care

## 2017-02-18 VITALS — BP 160/90 | HR 80 | Temp 98.5°F | Ht 71.0 in | Wt 171.4 lb

## 2017-02-18 DIAGNOSIS — L089 Local infection of the skin and subcutaneous tissue, unspecified: Secondary | ICD-10-CM | POA: Diagnosis not present

## 2017-02-18 DIAGNOSIS — Z23 Encounter for immunization: Secondary | ICD-10-CM

## 2017-02-18 MED ORDER — DOXYCYCLINE HYCLATE 100 MG PO CAPS: 100.0000 mg | ORAL_CAPSULE | Freq: Two times a day (BID) | ORAL | 0 refills | Status: DC

## 2017-02-18 MED ORDER — FLUCONAZOLE 150 MG PO TABS: 150.0000 mg | ORAL_TABLET | ORAL | 0 refills | Status: DC

## 2017-02-18 NOTE — Patient Instructions (Addendum)
Please keep feet dry while taking antibiotic and antifungal medication. Work restrictions are to apply for a period of 2 weeks.    IF you received an x-ray today, you will receive an invoice from [facility]. Please contact [facility] at [phone] with questions or concerns regarding your invoice.   IF you received labwork today, you will receive an invoice from [facility]. Please contact [facility] at [phone] with questions or concerns regarding your invoice.   Our billing staff will not be able to assist you with questions regarding bills from these companies.  You will be contacted with the lab results as soon as they are available. The fastest way to get your results is to activate your My Chart account. Instructions are located on the last page of this paperwork. If you have not heard from us regarding the results in 2 weeks, please contact this office.

## 2017-02-18 NOTE — Progress Notes (Signed)
  MRN: [id number] DOB: 20-Sep-1956  Subjective:   Travis Trevino is a 61 y.o. male presenting for chief complaint of Toe Pain (X 2 weeks- infected toe)  Reports 2 week history of left sided infection over web spaces of 4th-5th toes. Started out with soreness, progressed to redness, is very itchy. Has been using lotion, lotrimin spray with minimal relief. 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   Travis Trevino has a current medication list which includes the following prescription(s): gabapentin. Also has No Known Allergies.  Travis Trevino  has a past medical history of Arthritis and Heart murmur. Also  has a past surgical history that includes Knee arthroscopy w/ meniscal repair (Left).  Objective:   Vitals: BP (!) 160/90 (BP Location: Right Arm, Patient Position: Sitting, Cuff Size: Small)   Pulse 80   Temp 98.5 F (36.9 C) (Oral)   Ht 5\' 11"  (1.803 m)   Wt 171 lb 6.4 oz (77.7 kg)   SpO2 99%   BMI 23.91 kg/m   Physical Exam  Constitutional: He is oriented to person, place, and time. He appears well-developed and well-nourished.  Cardiovascular: Normal rate.   Pulmonary/Chest: Effort normal.  Musculoskeletal:       Left foot: There is normal range of motion, no tenderness, no bony tenderness, no swelling, normal capillary refill, no crepitus, no deformity and no laceration.       Feet:  Neurological: He is alert and oriented to person, place, and time.        Assessment and Plan :   1. Local skin infection 2. Left foot infection - I suspect fungal infection but will cover for secondary bacterial infection. Work restrictions to apply for 2 weeks. RTC if no improvement or worsening symptoms.  3. Need for prophylactic vaccination and inoculation against influenza - Flu Vaccine QUAD 36+ mos IM   Wallis BambergMario Joyceann Kruser, PA-C [facility] [phone] 02/18/2017  12:24 PM

## 2017-04-13 ENCOUNTER — Encounter: Payer: Self-pay | Admitting: Family Medicine

## 2017-04-13 ENCOUNTER — Ambulatory Visit (INDEPENDENT_AMBULATORY_CARE_PROVIDER_SITE_OTHER): Payer: 59 | Admitting: Family Medicine

## 2017-04-13 VITALS — BP 155/96 | HR 61 | Temp 98.2°F | Resp 17 | Ht 71.0 in | Wt 174.0 lb

## 2017-04-13 DIAGNOSIS — B353 Tinea pedis: Secondary | ICD-10-CM

## 2017-04-13 DIAGNOSIS — L03032 Cellulitis of left toe: Secondary | ICD-10-CM | POA: Diagnosis not present

## 2017-04-13 MED ORDER — DOXYCYCLINE HYCLATE 100 MG PO CAPS: 100.0000 mg | ORAL_CAPSULE | Freq: Two times a day (BID) | ORAL | 0 refills | Status: DC

## 2017-04-13 MED ORDER — TERBINAFINE HCL 250 MG PO TABS: 250.0000 mg | ORAL_TABLET | Freq: Every day | ORAL | 0 refills | Status: DC

## 2017-04-13 NOTE — Progress Notes (Signed)
Patient ID: Travis Trevino, male    DOB: [birth year]  Age: 61 y.o. MRN: [id number]  Chief Complaint  Patient presents with  . Toe Pain    Left foot, toe pain and ball of foot onset 1 week    Subjective:   Patient has a history of several times in the past years developing cellulitis on his foot. He gets bad athlete's foot and then gets pain infection surrounding it. He picks at the skin with it peels. He does go swimming a lot. 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 He wears steel toed work boots. 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 He has been getting worse in his left foot over the past week.  Current allergies, medications, problem list, past/family and social histories reviewed.  Objective:  BP (!) 155/96 (BP Location: Right Arm, Patient Position: Sitting, Cuff Size: Normal)   Pulse 61   Temp 98.2 F (36.8 C) (Oral)   Resp 17   Ht  (1.803 m)   Wt 174 lb (78.9 kg)   SpO2 98%   BMI 24.27 kg/m   Patient has tenderness at the base of the left fifth toe. He has terrible cracking and peeling of the skin between his fourth and fifth interspaces. He has peeled some of the skin away. Good pedal pulses.  Assessment & Plan:   Assessment: 1. Tinea pedis of left foot   2. Cellulitis of toe of left foot       Plan: See instructions. He had a normal nonfasting sugar a couple of months ago so I did not pursue checking for diabetes.  No orders of the defined types were placed in this encounter.   Meds ordered this encounter  Medications  . doxycycline (VIBRAMYCIN) 100 MG capsule    Sig: Take 1 capsule (100 mg total) by mouth 2 (two) times daily.    Dispense:  20 capsule    Refill:  0  . terbinafine (LAMISIL) 250 MG tablet    Sig: Take 1 tablet (250 mg total) by mouth daily.    Dispense:  14 tablet    Refill:  0         Patient Instructions   Take terbinafine 1 pill daily for 2 weeks for the  fungus.  Take doxycycline 1 twice daily for skin infection  Keep your feet clean and dry. I recommend cotton socks.  Use over-the-counter Lotrimin/clotrimazole cream twice daily on the rash/raw skin area.   Once it is healed up well watch it carefully and if you start getting crusting resume using the cream immediately but if it is still getting worse despite that come back.      IF you received an x-ray today, you will receive an invoice from [facility]. Please contact [facility] at [phone] with questions or concerns regarding your invoice.   IF you received labwork today, you will receive an invoice from [facility]. Please contact [facility] at [phone] with questions or concerns regarding your invoice.   Our billing staff will not be able to assist you with questions regarding bills from these companies.  You will be contacted with the lab results as soon as they are available. The fastest way to get your results is to activate your My Chart account. Instructions are located on the last page of this paperwork. If you have not heard from [place] regarding the results in 2  weeks, please contact this office.        Return if symptoms worsen or fail to improve.   Tarisa Paola, MD 04/13/2017

## 2017-04-13 NOTE — Patient Instructions (Addendum)
Take terbinafine 1 pill daily for 2 weeks for the fungus.  Take doxycycline 1 twice daily for skin infection  Keep your feet clean and dry. I recommend cotton socks.  Use over-the-counter Lotrimin/clotrimazole cream twice daily on the rash/raw skin area.   Once it is healed up well watch it carefully and if you start getting crusting resume using the cream immediately but if it is still getting worse despite that come back.      IF you received an x-ray today, you will receive an invoice from [facility]. Please contact [facility] at [phone] with questions or concerns regarding your invoice.   IF you received labwork today, you will receive an invoice from [place]. Please contact [facility] at [phone] with questions or concerns regarding your invoice.   Our billing staff will not be able to assist you with questions regarding bills from these companies.  You will be contacted with the lab results as soon as they are available. The fastest way to get your results is to activate your My Chart account. Instructions are located on the last page of this paperwork. If you have not heard from [place] regarding the results in 2 weeks, please contact this office.

## 2018-03-02 ENCOUNTER — Encounter: Payer: Self-pay | Admitting: Urgent Care

## 2018-03-02 ENCOUNTER — Ambulatory Visit: Payer: 59 | Admitting: Urgent Care

## 2018-03-02 VITALS — BP 149/89 | HR 74 | Temp 97.5°F | Resp 18 | Ht 71.0 in | Wt 179.6 lb

## 2018-03-02 DIAGNOSIS — L03032 Cellulitis of left toe: Secondary | ICD-10-CM | POA: Diagnosis not present

## 2018-03-02 DIAGNOSIS — B353 Tinea pedis: Secondary | ICD-10-CM

## 2018-03-02 MED ORDER — FLUCONAZOLE 150 MG PO TABS: 150.0000 mg | ORAL_TABLET | ORAL | 0 refills | Status: DC

## 2018-03-02 MED ORDER — CEPHALEXIN 500 MG PO CAPS: 500.0000 mg | ORAL_CAPSULE | Freq: Three times a day (TID) | ORAL | 0 refills | Status: DC

## 2018-03-02 NOTE — Patient Instructions (Addendum)
Cellulitis, Adult Cellulitis is a skin infection. The infected area is usually red and sore. This condition occurs most often in the arms and lower legs. It is very important to get treated for this condition. Follow these instructions at home:  Take over-the-counter and prescription medicines only as told by your doctor.  If you were prescribed an antibiotic medicine, take it as told by your doctor. Do not stop taking the antibiotic even if you start to feel better.  Drink enough fluid to keep your pee (urine) clear or pale yellow.  Do not touch or rub the infected area.  Raise (elevate) the infected area above the level of your heart while you are sitting or lying down.  Place warm or cold wet cloths (warm or cold compresses) on the infected area. Do this as told by your doctor.  Keep all follow-up visits as told by your doctor. This is important. These visits let your doctor make sure your infection is not getting worse. Contact a doctor if:  You have a fever.  Your symptoms do not get better after 1-2 days of treatment.  Your bone or joint under the infected area starts to hurt after the skin has healed.  Your infection comes back. This can happen in the same area or another area.  You have a swollen bump in the infected area.  You have new symptoms.  You feel ill and also have muscle aches and pains. Get help right away if:  Your symptoms get worse.  You feel very sleepy.  You throw up (vomit) or have watery poop (diarrhea) for a long time.  There are red streaks coming from the infected area.  Your red area gets larger.  Your red area turns darker. This information is not intended to replace advice given to you by your health care provider. Make sure you discuss any questions you have with your health care provider. Document Released: 05/19/2008 Document Revised: 05/08/2016 Document Reviewed: 10/10/2015 Elsevier Interactive Patient Education  2018 Elsevier  Inc.     Athlete's Foot Athlete's foot (tinea pedis) is a fungal infection of the skin on the feet. It often occurs on the skin that is between or underneath the toes. It can also occur on the soles of the feet. The infection can spread from person to person (is contagious). What are the causes? Athlete's foot is caused by a fungus. This fungus grows in warm, moist places. Most people get athlete's foot by sharing shower stalls, towels, and wet floors with someone who is infected. Not washing your feet or changing your socks often enough can contribute to athlete's foot. What increases the risk? This condition is more likely to develop in:  Men.  People who have a weak body defense system (immune system).  People who have diabetes.  People who use public showers, such as at a gym.  People who wear heavy-duty shoes, such as Youth worker.  Seasons with warm, humid weather.  What are the signs or symptoms? Symptoms of this condition include:  Itchy areas between the toes or on the soles of the feet.  White, flaky, or scaly areas between the toes or on the soles of the feet.  Very itchy small blisters between the toes or on the soles of the feet.  Small cuts on the skin. These cuts can become infected.  Thick or discolored toenails.  How is this diagnosed? This condition is diagnosed with a medical history and physical exam. Your health  care provider may also take a skin or toenail sample to be examined. How is this treated? Treatment for this condition includes antifungal medicines. These may be applied as powders, ointments, or creams. In severe cases, an oral antifungal medicine may be given. Follow these instructions at home:  Apply or take over-the-counter and prescription medicines only as told by your health care provider.  Keep all follow-up visits as told by your health care provider. This is important.  Do not scratch your feet.  Keep your feet  dry: ? Wear cotton or wool socks. Change your socks every day or if they become wet. ? Wear shoes that allow air to circulate, such as sandals or canvas tennis shoes.  Wash and dry your feet: ? Every day or as told by your health care provider. ? After exercising. ? Including the area between your toes.  Do not share towels, nail clippers, or other personal items that touch your feet with others.  If you have diabetes, keep your blood sugar under control. How is this prevented?  Do not share towels.  Wear sandals in wet areas, such as locker rooms and shared showers.  Keep your feet dry: ? Wear cotton or wool socks. Change your socks every day or if they become wet. ? Wear shoes that allow air to circulate, such as sandals or canvas tennis shoes.  Wash and dry your feet after exercising. Pay attention to the area between your toes. Contact a health care provider if:  You have a fever.  You have swelling, soreness, warmth, or redness in your foot.  You are not getting better with treatment.  Your symptoms get worse.  You have new symptoms. This information is not intended to replace advice given to you by your health care provider. Make sure you discuss any questions you have with your health care provider. Document Released: 11/28/2000 Document Revised: 05/08/2016 Document Reviewed: 06/04/2015 Elsevier Interactive Patient Education  2018 [employer]     IF you received an x-ray today, you will receive an invoice from [facility]. Please contact [facility] at [phone] with questions or concerns regarding your invoice.   IF you received labwork today, you will receive an invoice from [facility]. Please contact [facility] at [phone] with questions or concerns regarding your invoice.   Our billing staff will not be able to assist you with questions regarding bills from these companies.  You will be contacted with the lab results as soon as they  are available. The fastest way to get your results is to activate your My Chart account. Instructions are located on the last page of this paperwork. If you have not heard from us regarding the results in 2 weeks, please contact this office.

## 2018-03-02 NOTE — Progress Notes (Signed)
    MRN: [id number] DOB: 1956/04/17  Subjective:   Travis Trevino is a 62 y.o. male presenting for recurrent rash over left foot. Symptoms started after he went swimming. Has some pain, redness of his skin. Denies fever, red streaks. Previously using doxycycline, diflucan with good relief but patient would like to try a different antibiotic. Denies smoking cigarettes.  Travis Trevino has a current medication list which includes the following prescription(s): doxycycline and terbinafine. Also has No Known Allergies.  Travis Trevino  has a past medical history of Arthritis and Heart murmur. Also  has a past surgical history that includes Knee arthroscopy w/ meniscal repair (Left).  Objective:   Vitals: BP (!) 149/89   Pulse 74   Temp (!) 97.5 F (36.4 C) (Oral)   Resp 18   Ht 5\' 11"  (1.803 m)   Wt 179 lb 9.6 oz (81.5 kg)   SpO2 95%   BMI 25.05 kg/m   BP Readings from Last 3 Encounters:  03/02/18 (!) 149/89  04/13/17 (!) 155/96  02/18/17 (!) 160/90    Physical Exam  Constitutional: He is oriented to person, place, and time. He appears well-developed and well-nourished.  Cardiovascular: Normal rate.  Pulmonary/Chest: Effort normal.  Musculoskeletal:       Feet:  Neurological: He is alert and oriented to person, place, and time.  Skin: Skin is warm and dry.   Assessment and Plan :   Cellulitis of toe of left foot  Tinea pedis of left foot  Start Keflex, diflucan. Return-to-clinic precautions discussed, patient verbalized understanding.   Wallis BambergMario Athen Riel, PA-C Primary Care at [facility] [phone] 03/02/2018  10:49 AM

## 2018-03-30 ENCOUNTER — Ambulatory Visit: Payer: Self-pay | Admitting: Urgent Care

## 2018-05-11 ENCOUNTER — Ambulatory Visit: Payer: 59 | Admitting: Family Medicine

## 2018-05-11 ENCOUNTER — Encounter: Payer: Self-pay | Admitting: Family Medicine

## 2018-05-11 ENCOUNTER — Other Ambulatory Visit: Payer: Self-pay

## 2018-05-11 VITALS — BP 160/90 | HR 76 | Temp 98.8°F | Ht 74.0 in | Wt 177.8 lb

## 2018-05-11 DIAGNOSIS — L03032 Cellulitis of left toe: Secondary | ICD-10-CM

## 2018-05-11 DIAGNOSIS — Z23 Encounter for immunization: Secondary | ICD-10-CM | POA: Diagnosis not present

## 2018-05-11 DIAGNOSIS — I10 Essential (primary) hypertension: Secondary | ICD-10-CM | POA: Diagnosis not present

## 2018-05-11 DIAGNOSIS — B353 Tinea pedis: Secondary | ICD-10-CM

## 2018-05-11 LAB — COMPREHENSIVE METABOLIC PANEL
ALT: 21 IU/L (ref 0–44)
AST: 21 IU/L (ref 0–40)
Albumin/Globulin Ratio: 1.3 (ref 1.2–2.2)
Albumin: 3.9 g/dL (ref 3.6–4.8)
Alkaline Phosphatase: 72 IU/L (ref 39–117)
BUN/Creatinine Ratio: 11 (ref 10–24)
BUN: 11 mg/dL (ref 8–27)
Bilirubin Total: 0.4 mg/dL (ref 0.0–1.2)
CO2: 21 mmol/L (ref 20–29)
Calcium: 8.9 mg/dL (ref 8.6–10.2)
Chloride: 109 mmol/L — ABNORMAL HIGH (ref 96–106)
Creatinine, Ser: 1.01 mg/dL (ref 0.76–1.27)
GFR calc Af Amer: 92 mL/min/{1.73_m2} (ref >59)
GFR calc non Af Amer: 79 mL/min/{1.73_m2} (ref >59)
Globulin, Total: 2.9 g/dL (ref 1.5–4.5)
Glucose: 96 mg/dL (ref 65–99)
Potassium: 3.8 mmol/L (ref 3.5–5.2)
Sodium: 146 mmol/L — ABNORMAL HIGH (ref 134–144)
Total Protein: 6.8 g/dL (ref 6.0–8.5)

## 2018-05-11 LAB — CBC
Hematocrit: 39.8 % (ref 37.5–51.0)
Hemoglobin: 13.9 g/dL (ref 13.0–17.7)
MCH: 30.8 pg (ref 26.6–33.0)
MCHC: 34.9 g/dL (ref 31.5–35.7)
MCV: 88 fL (ref 79–97)
Platelets: 417 10*3/uL (ref 150–450)
RBC: 4.52 x10E6/uL (ref 4.14–5.80)
RDW: 14.3 % (ref 12.3–15.4)
WBC: 6.9 10*3/uL (ref 3.4–10.8)

## 2018-05-11 MED ORDER — AMLODIPINE BESYLATE 5 MG PO TABS: 5.0000 mg | ORAL_TABLET | Freq: Every day | ORAL | 3 refills | Status: DC

## 2018-05-11 MED ORDER — FLUCONAZOLE 150 MG PO TABS: 150.0000 mg | ORAL_TABLET | ORAL | 0 refills | Status: DC

## 2018-05-11 MED ORDER — CEPHALEXIN 500 MG PO CAPS: 500.0000 mg | ORAL_CAPSULE | Freq: Three times a day (TID) | ORAL | 0 refills | Status: DC

## 2018-05-11 NOTE — Progress Notes (Signed)
5/28/201911:01 AM  Travis Trevino 03-07-1956, 62 y.o. male [id number]  Chief Complaint  Patient presents with  . Pain    left foot, small toe is swollen. Says just came from the beach, had some sand irritation. Cleaned with soap and water and alcohol    HPI:   Patient is a 62 y.o. male with past medical history significant for curled in left pinky toes who presents today for swelling, redness and maceration of that toe He states this started after going to the beach in sneakers without socks He denies any trauma He has been applying tinactum powder Similar situation happened in march 2019 after going to the beach  Fall Risk  05/11/2018 03/02/2018 02/18/2017 11/15/2016 05/07/2016  Falls in the past year? No No No No No     Depression screen [facility] 2/9 05/11/2018 03/02/2018 04/13/2017  Decreased Interest 0 0 0  Down, Depressed, Hopeless 0 0 0  PHQ - 2 Score 0 0 0    No Known Allergies  Prior to Admission medications   Medication Sig Start Date End Date Taking? Authorizing Provider  cephALEXin (KEFLEX) 500 MG capsule Take 1 capsule (500 mg total) by mouth 3 (three) times daily. 03/02/18   Wallis Bamberg, PA-C  fluconazole (DIFLUCAN) 150 MG tablet Take 1 tablet (150 mg total) by mouth once a week. 03/02/18   Wallis Bamberg, PA-C    Past Medical History:  Diagnosis Date  . Arthritis   . Heart murmur     Past Surgical History:  Procedure Laterality Date  . KNEE ARTHROSCOPY W/ MENISCAL REPAIR Left    06/2016    Social History   Tobacco Use  . Smoking status: Current Some Day Smoker  . Smokeless tobacco: Never Used  Substance Use Topics  . Alcohol use: Yes    Comment: social    Family History  Problem Relation Age of Onset  . Cancer Maternal Aunt     Review of Systems  Constitutional: Negative for chills and fever.  Respiratory: Negative for shortness of breath.   Cardiovascular: Negative for chest pain.  Musculoskeletal: Positive for joint pain.  Neurological: Negative  for tingling.   Per hpi  OBJECTIVE:  Blood pressure (!) 160/90, pulse 76, temperature 98.8 F (37.1 C), temperature source Oral, height  (1.88 m), weight 177 lb 12.8 oz (80.6 kg), SpO2 97 %.  BP Readings from Last 3 Encounters:  05/11/18 (!) 160/90  03/02/18 (!) 149/89  04/13/17 (!) 155/96    Physical Exam  Gen: AAOx3, NAD Left foot: left pinky toe flexed, unable to extend, mildly swollen, erythematous and warm. Mildly ttp, maceration and scaling of skin underneath.    ASSESSMENT and PLAN  1. Cellulitis of toe of left foot 2. Tinea pedis of left foot - Ambulatory referral to Podiatry Patient did well with previous treatment, will repeat, sending to podiatry to evaluate if surgical correction appropriate as difficult to maintain clean and dry  3. Essential hypertension, benign More than 50% of this 25 min visit was spent in counseling and/or  coordination of care regarding HTN. Patient with persistently elevated BP in clinic, + fhx, discussed treatment options. Starting amlodpine , new med r/se/b reviewed. LFM addressed.  - CBC - Comprehensive metabolic panel  4. Need for vaccination last tetanus > 10 years ago. Booster given today  Other orders - cephALEXin (KEFLEX) 500 MG capsule; Take 1 capsule (500 mg total) by mouth 3 (three) times daily. - fluconazole (DIFLUCAN) 150 MG tablet; Take 1  tablet (150 mg total) by mouth once a week. - amLODipine (NORVASC) 5 MG tablet; Take 1 tablet (5 mg total) by mouth daily. - Td vaccine greater than or equal to 7yo preservative free IM  Return in about 1 month (around 06/08/2018).    Myles Lipps, MD Primary Care at [facility] [address] [place], [place] [address] Ph.  [phone] Fax [phone]

## 2018-05-11 NOTE — Patient Instructions (Addendum)
   IF you received an x-ray today, you will receive an invoice from [facility]. Please contact [facility] at [phone] with questions or concerns regarding your invoice.   IF you received labwork today, you will receive an invoice from [facility]. Please contact [facility] at [phone] with questions or concerns regarding your invoice.   Our billing staff will not be able to assist you with questions regarding bills from these companies.  You will be contacted with the lab results as soon as they are available. The fastest way to get your results is to activate your My Chart account. Instructions are located on the last page of this paperwork. If you have not heard from us regarding the results in 2 weeks, please contact this office.      Managing Your Hypertension Hypertension is commonly called high blood pressure. This is when the force of your blood pressing against the walls of your arteries is too strong. Arteries are blood vessels that carry blood from your heart throughout your body. Hypertension forces the heart to work harder to pump blood, and may cause the arteries to become narrow or stiff. Having untreated or uncontrolled hypertension can cause heart attack, stroke, kidney disease, and other problems. What are blood pressure readings? A blood pressure reading consists of a higher number over a lower number. Ideally, your blood pressure should be below 120/80. The first ("top") number is called the systolic pressure. It is a measure of the pressure in your arteries as your heart beats. The second ("bottom") number is called the diastolic pressure. It is a measure of the pressure in your arteries as the heart relaxes. What does my blood pressure reading mean? Blood pressure is classified into four stages. Based on your blood pressure reading, your health care provider may use the following stages to determine what type of treatment you need, if any. Systolic  pressure and diastolic pressure are measured in a unit called mm Hg. Normal  Systolic pressure: below 120.  Diastolic pressure: below 80. Elevated  Systolic pressure: 120-129.  Diastolic pressure: below 80. Hypertension stage 1  Systolic pressure: 130-139.  Diastolic pressure: 80-89. Hypertension stage 2  Systolic pressure: 140 or above.  Diastolic pressure: 90 or above. What health risks are associated with hypertension? Managing your hypertension is an important responsibility. Uncontrolled hypertension can lead to:  A heart attack.  A stroke.  A weakened blood vessel (aneurysm).  Heart failure.  Kidney damage.  Eye damage.  Metabolic syndrome.  Memory and concentration problems.  What changes can I make to manage my hypertension? Hypertension can be managed by making lifestyle changes and possibly by taking medicines. Your health care provider will help you make a plan to bring your blood pressure within a normal range. Eating and drinking  Eat a diet that is high in fiber and potassium, and low in salt (sodium), added sugar, and fat. An example eating plan is called the DASH (Dietary Approaches to Stop Hypertension) diet. To eat this way: ? Eat plenty of fresh fruits and vegetables. Try to fill half of your plate at each meal with fruits and vegetables. ? Eat whole grains, such as whole wheat pasta, brown rice, or whole grain bread. Fill about one quarter of your plate with whole grains. ? Eat low-fat diary products. ? Avoid fatty cuts of meat, processed or cured meats, and poultry with skin. Fill about one quarter of your plate with lean proteins such as fish, chicken without skin, beans, eggs,   and tofu. ? Avoid premade and processed foods. These tend to be higher in sodium, added sugar, and fat.  Reduce your daily sodium intake. Most people with hypertension should eat less than 1,500 mg of sodium a day.  Limit alcohol intake to no more than 1 drink a day  for nonpregnant women and 2 drinks a day for men. One drink equals 12 oz of beer, 5 oz of wine, or 1 oz of hard liquor. Lifestyle  Work with your health care provider to maintain a healthy body weight, or to lose weight. Ask what an ideal weight is for you.  Get at least 30 minutes of exercise that causes your heart to beat faster (aerobic exercise) most days of the week. Activities may include walking, swimming, or biking.  Include exercise to strengthen your muscles (resistance exercise), such as weight lifting, as part of your weekly exercise routine. Try to do these types of exercises for 30 minutes at least 3 days a week.  Do not use any products that contain nicotine or tobacco, such as cigarettes and e-cigarettes. If you need help quitting, ask your health care provider.  Control any long-term (chronic) conditions you have, such as high cholesterol or diabetes. Monitoring  Monitor your blood pressure at home as told by your health care provider. Your personal target blood pressure may vary depending on your medical conditions, your age, and other factors.  Have your blood pressure checked regularly, as often as told by your health care provider. Working with your health care provider  Review all the medicines you take with your health care provider because there may be side effects or interactions.  Talk with your health care provider about your diet, exercise habits, and other lifestyle factors that may be contributing to hypertension.  Visit your health care provider regularly. Your health care provider can help you create and adjust your plan for managing hypertension. Will I need medicine to control my blood pressure? Your health care provider may prescribe medicine if lifestyle changes are not enough to get your blood pressure under control, and if:  Your systolic blood pressure is 130 or higher.  Your diastolic blood pressure is 80 or higher.  Take medicines only as told  by your health care provider. Follow the directions carefully. Blood pressure medicines must be taken as prescribed. The medicine does not work as well when you skip doses. Skipping doses also puts you at risk for problems. Contact a health care provider if:  You think you are having a reaction to medicines you have taken.  You have repeated (recurrent) headaches.  You feel dizzy.  You have swelling in your ankles.  You have trouble with your vision. Get help right away if:  You develop a severe headache or confusion.  You have unusual weakness or numbness, or you feel faint.  You have severe pain in your chest or abdomen.  You vomit repeatedly.  You have trouble breathing. Summary  Hypertension is when the force of blood pumping through your arteries is too strong. If this condition is not controlled, it may put you at risk for serious complications.  Your personal target blood pressure may vary depending on your medical conditions, your age, and other factors. For most people, a normal blood pressure is less than 120/80.  Hypertension is managed by lifestyle changes, medicines, or both. Lifestyle changes include weight loss, eating a healthy, low-sodium diet, exercising more, and limiting alcohol. This information is not intended to replace advice   given to you by your health care provider. Make sure you discuss any questions you have with your health care provider. Document Released: 08/25/2012 Document Revised: 10/29/2016 Document Reviewed: 10/29/2016 Elsevier Interactive Patient Education  2018 Elsevier Inc.  

## 2018-05-18 ENCOUNTER — Ambulatory Visit: Payer: Self-pay | Admitting: Podiatry

## 2018-05-25 ENCOUNTER — Ambulatory Visit: Payer: 59 | Admitting: Podiatry

## 2018-05-25 ENCOUNTER — Encounter: Payer: Self-pay | Admitting: Podiatry

## 2018-05-25 VITALS — BP 142/88 | HR 68 | Resp 16

## 2018-05-25 DIAGNOSIS — M2042 Other hammer toe(s) (acquired), left foot: Secondary | ICD-10-CM | POA: Diagnosis not present

## 2018-05-25 DIAGNOSIS — B353 Tinea pedis: Secondary | ICD-10-CM | POA: Diagnosis not present

## 2018-05-25 MED ORDER — TERBINAFINE HCL 250 MG PO TABS: 250.0000 mg | ORAL_TABLET | Freq: Every day | ORAL | 0 refills | Status: DC

## 2018-05-25 MED ORDER — CASTELLANI PAINT 1.5 % EX LIQD: 1.0000 "application " | Freq: Every day | CUTANEOUS | 3 refills | Status: DC

## 2018-05-25 NOTE — Progress Notes (Signed)
  Subjective:  Patient ID: Travis Trevino, male    DOB: 1956-07-05,  MRN: [id number] HPI Chief Complaint  Patient presents with  . Skin Problem    Between 4th and 5th toes left - wet, macerated skin x 6 months, itches, tried powders, antibiotic and diflucan rx'd by PCP-some help  . New Patient (Initial Visit)    62 y.o. male presents with the above complaint.   ROS: Denies fever chills nausea vomiting muscle aches pains calf pain back pain chest pain shortness of breath.  Past Medical History:  Diagnosis Date  . Arthritis   . Heart murmur    Past Surgical History:  Procedure Laterality Date  . KNEE ARTHROSCOPY W/ MENISCAL REPAIR Left    06/2016    Current Outpatient Medications:  .  amLODipine (NORVASC) 5 MG tablet, Take 1 tablet (5 mg total) by mouth daily., Disp: 30 tablet, Rfl: 3 .  Castellani Paint 1.5 % LIQD, Apply 1 application topically daily., Disp: 29.57 mL, Rfl: 3 .  cephALEXin (KEFLEX) 500 MG capsule, Take 1 capsule (500 mg total) by mouth 3 (three) times daily., Disp: 30 capsule, Rfl: 0 .  fluconazole (DIFLUCAN) 150 MG tablet, Take 1 tablet (150 mg total) by mouth once a week., Disp: 6 tablet, Rfl: 0 .  terbinafine (LAMISIL) 250 MG tablet, Take 1 tablet (250 mg total) by mouth daily., Disp: 30 tablet, Rfl: 0  No Known Allergies Review of Systems Objective:   Vitals:   05/25/18 0955  BP: (!) 142/88  Pulse: 68  Resp: 16    General: Well developed, nourished, in no acute distress, alert and oriented x3   Dermatological: Skin is warm, dry and supple bilateral. Nails x 10 are well maintained; remaining integument appears unremarkable at this time. There are no open sores, no preulcerative lesions, no rash or signs of infection present.  Interdigital maceration fourth interdigital space left foot.  Appears to be tinea pedis or just maceration.  Vascular: Dorsalis Pedis artery and Posterior Tibial artery pedal pulses are 2/4 bilateral with immedate capillary fill  time. Pedal hair growth present. No varicosities and no lower extremity edema present bilateral.   Neruologic: Grossly intact via light touch bilateral. Vibratory intact via tuning fork bilateral. Protective threshold with Semmes Wienstein monofilament intact to all pedal sites bilateral. Patellar and Achilles deep tendon reflexes 2+ bilateral. No Babinski or clonus noted bilateral.   Musculoskeletal: No gross boney pedal deformities bilateral. No pain, crepitus, or limitation noted with foot and ankle range of motion bilateral. Muscular strength 5/5 in all groups tested bilateral.  Gait: Unassisted, Nonantalgic.    Radiographs:  None taken  Assessment & Plan:   Assessment: InterDigital tinea pedis with adductovarus rotated hammertoe deformity fifth left.  Plan: Dispensed a prescription for Castellani's paint as well as oral terbinafine.  I did express to him that his best option would be to have surgery performed to D rotate that toe and allow air to get into the area.  I will see him in 1 month.     Travis Trevino, [place]

## 2018-06-08 ENCOUNTER — Ambulatory Visit: Payer: 59 | Admitting: Family Medicine

## 2018-06-11 ENCOUNTER — Ambulatory Visit: Payer: Self-pay | Admitting: Family Medicine

## 2018-06-30 ENCOUNTER — Ambulatory Visit (INDEPENDENT_AMBULATORY_CARE_PROVIDER_SITE_OTHER): Payer: 59

## 2018-06-30 ENCOUNTER — Other Ambulatory Visit: Payer: Self-pay

## 2018-06-30 ENCOUNTER — Ambulatory Visit: Payer: 59 | Admitting: Emergency Medicine

## 2018-06-30 ENCOUNTER — Encounter: Payer: Self-pay | Admitting: Emergency Medicine

## 2018-06-30 VITALS — BP 154/90 | HR 69 | Temp 97.8°F | Resp 16 | Ht 71.5 in | Wt 179.6 lb

## 2018-06-30 DIAGNOSIS — I10 Essential (primary) hypertension: Secondary | ICD-10-CM | POA: Diagnosis not present

## 2018-06-30 DIAGNOSIS — R079 Chest pain, unspecified: Secondary | ICD-10-CM | POA: Diagnosis not present

## 2018-06-30 DIAGNOSIS — F17210 Nicotine dependence, cigarettes, uncomplicated: Secondary | ICD-10-CM

## 2018-06-30 LAB — COMPREHENSIVE METABOLIC PANEL
ALT: 24 IU/L (ref 0–44)
AST: 22 IU/L (ref 0–40)
Albumin/Globulin Ratio: 1.3 (ref 1.2–2.2)
Albumin: 3.9 g/dL (ref 3.6–4.8)
Alkaline Phosphatase: 75 IU/L (ref 39–117)
BUN/Creatinine Ratio: 8 — ABNORMAL LOW (ref 10–24)
BUN: 9 mg/dL (ref 8–27)
Bilirubin Total: 0.3 mg/dL (ref 0.0–1.2)
CO2: 20 mmol/L (ref 20–29)
Calcium: 9 mg/dL (ref 8.6–10.2)
Chloride: 104 mmol/L (ref 96–106)
Creatinine, Ser: 1.17 mg/dL (ref 0.76–1.27)
GFR calc non Af Amer: 66 mL/min/{1.73_m2} (ref >59)
GFR, EST AFRICAN AMERICAN: 77 mL/min/{1.73_m2} (ref >59)
GLUCOSE: 104 mg/dL — AB (ref 65–99)
Globulin, Total: 3.1 g/dL (ref 1.5–4.5)
Potassium: 3.4 mmol/L — ABNORMAL LOW (ref 3.5–5.2)
Sodium: 140 mmol/L (ref 134–144)
Total Protein: 7 g/dL (ref 6.0–8.5)

## 2018-06-30 LAB — CBC WITH DIFFERENTIAL/PLATELET
BASOS ABS: 0 10*3/uL (ref 0.0–0.2)
Basos: 0 %
EOS (ABSOLUTE): 0.1 10*3/uL (ref 0.0–0.4)
Eos: 1 %
HEMOGLOBIN: 13.7 g/dL (ref 13.0–17.7)
Hematocrit: 41.3 % (ref 37.5–51.0)
Immature Grans (Abs): 0 10*3/uL (ref 0.0–0.1)
Immature Granulocytes: 0 %
Lymphocytes Absolute: 2 10*3/uL (ref 0.7–3.1)
Lymphs: 31 %
MCH: 30.3 pg (ref 26.6–33.0)
MCHC: 33.2 g/dL (ref 31.5–35.7)
MCV: 91 fL (ref 79–97)
MONOCYTES: 9 %
Monocytes Absolute: 0.6 10*3/uL (ref 0.1–0.9)
NEUTROS ABS: 3.9 10*3/uL (ref 1.4–7.0)
Neutrophils: 59 %
PLATELETS: 437 10*3/uL (ref 150–450)
RBC: 4.52 x10E6/uL (ref 4.14–5.80)
RDW: 14.3 % (ref 12.3–15.4)
WBC: 6.6 10*3/uL (ref 3.4–10.8)

## 2018-06-30 LAB — LIPID PANEL
CHOL/HDL RATIO: 4.4 ratio (ref 0.0–5.0)
Cholesterol, Total: 209 mg/dL — ABNORMAL HIGH (ref 100–199)
HDL: 47 mg/dL (ref >39)
LDL Calculated: 134 mg/dL — ABNORMAL HIGH (ref 0–99)
Triglycerides: 138 mg/dL (ref 0–149)
VLDL CHOLESTEROL CAL: 28 mg/dL (ref 5–40)

## 2018-06-30 MED ORDER — LISINOPRIL 10 MG PO TABS: 10.0000 mg | ORAL_TABLET | Freq: Every day | ORAL | 3 refills | Status: DC

## 2018-06-30 NOTE — Patient Instructions (Signed)

## 2018-06-30 NOTE — Progress Notes (Signed)
Julieanne Mansonrnest R Slevin 62 y.o.   Chief Complaint  Patient presents with  . Chest Pain    x 2 weeks per patient having x 1 year on/off    HISTORY OF PRESENT ILLNESS: This is a 62 y.o. male complaining of chest pain on and off for 2 weeks.  "Like gas pain".  Also gets intermittent palpitations.  Chronic smoker.  Past medical history includes hypertension.  Denies chest pain on exertion.  Has very physical work and no history of angina.  Denies difficulty breathing.  No history of diabetes.  Started on amlodipine 1 month ago.  No significant family history for heart disease.  BP Readings from Last 3 Encounters:  06/30/18 (!) 154/90  05/25/18 (!) 142/88  05/11/18 (!) 160/90    HPI   Prior to Admission medications   Medication Sig Start Date End Date Taking? Authorizing Provider  amLODipine (NORVASC) 5 MG tablet Take 1 tablet (5 mg total) by mouth daily. 05/11/18  Yes Myles LippsSantiago, Irma M, MD  Robyne Askewastellani Paint 1.5 % LIQD Apply 1 application topically daily. Patient not taking: Reported on 06/30/2018 05/25/18   Hyatt, Max T, DPM  terbinafine (LAMISIL) 250 MG tablet Take 1 tablet (250 mg total) by mouth daily. Patient not taking: Reported on 06/30/2018 05/25/18   Elinor ParkinsonHyatt, Max T, DPM    No Known Allergies  Patient Active Problem List   Diagnosis Date Noted  . RLS (restless legs syndrome) 11/15/2016    Past Medical History:  Diagnosis Date  . Arthritis   . Heart murmur     Past Surgical History:  Procedure Laterality Date  . KNEE ARTHROSCOPY W/ MENISCAL REPAIR Left    06/2016    Social History   Socioeconomic History  . Marital status: Married    Spouse name: Not on file  . Number of children: Not on file  . Years of education: Not on file  . Highest education level: Not on file  Occupational History  . Not on file  Social Needs  . Financial resource strain: Not on file  . Food insecurity:    Worry: Not on file    Inability: Not on file  . Transportation needs:    Medical: Not  on file    Non-medical: Not on file  Tobacco Use  . Smoking status: Current Some Day Smoker  . Smokeless tobacco: Never Used  Substance and Sexual Activity  . Alcohol use: Yes    Comment: social  . Drug use: Never  . Sexual activity: Yes  Lifestyle  . Physical activity:    Days per week: Not on file    Minutes per session: Not on file  . Stress: Not on file  Relationships  . Social connections:    Talks on phone: Not on file    Gets together: Not on file    Attends religious service: Not on file    Active member of club or organization: Not on file    Attends meetings of clubs or organizations: Not on file    Relationship status: Not on file  . Intimate partner violence:    Fear of current or ex partner: Not on file    Emotionally abused: Not on file    Physically abused: Not on file    Forced sexual activity: Not on file  Other Topics Concern  . Not on file  Social History Narrative  . Not on file    Family History  Problem Relation Age of Onset  . Cancer  Maternal Aunt      Review of Systems  Constitutional: Negative.  Negative for chills and fever.  HENT: Negative.  Negative for congestion, hearing loss, nosebleeds and sore throat.   Eyes: Negative.  Negative for blurred vision and double vision.  Respiratory: Negative.  Negative for cough, shortness of breath and wheezing.   Cardiovascular: Positive for chest pain and palpitations.  Gastrointestinal: Negative.  Negative for abdominal pain, blood in stool, diarrhea, nausea and vomiting.  Genitourinary: Negative.  Negative for dysuria and hematuria.  Musculoskeletal: Negative.  Negative for back pain, myalgias and neck pain.  Skin: Negative.  Negative for rash.  Neurological: Negative.  Negative for dizziness and headaches.  Endo/Heme/Allergies: Negative.   All other systems reviewed and are negative.   Vitals:   06/30/18 0823  BP: (!) 154/90  Pulse: 69  Resp: 16  Temp: 97.8 F (36.6 C)  SpO2: 97%     Physical Exam  Constitutional: He is oriented to person, place, and time. He appears well-developed and well-nourished.  HENT:  Head: Normocephalic and atraumatic.  Nose: Nose normal.  Mouth/Throat: Oropharynx is clear and moist.  Eyes: Pupils are equal, round, and reactive to light. Conjunctivae and EOM are normal.  Neck: Normal range of motion. Neck supple. No JVD present. No thyromegaly present.  Cardiovascular: Normal rate, regular rhythm and normal heart sounds.  No murmur heard. Pulmonary/Chest: Effort normal and breath sounds normal. He has no wheezes. He has no rales.  Abdominal: Soft. Bowel sounds are normal. He exhibits no distension. There is no tenderness.  Musculoskeletal: Normal range of motion. He exhibits no edema.  Lymphadenopathy:    He has no cervical adenopathy.  Neurological: He is alert and oriented to person, place, and time. No sensory deficit. He exhibits normal muscle tone. Coordination normal.  Skin: Skin is warm and dry. Capillary refill takes less than 2 seconds.  Psychiatric: He has a normal mood and affect. His behavior is normal.  Vitals reviewed.   EKG: Normal sinus rhythm with ventricular rate of 67/min.  No acute ischemic changes.  LVH.  Normal intervals.  No axis deviation.  No significant changes from EKG done on 05/07/2016.  Dg Chest 2 View  Result Date: 06/30/2018 CLINICAL DATA:  Chest pain EXAM: CHEST - 2 VIEW COMPARISON:  None. FINDINGS: Heart and mediastinal contours are within normal limits. No focal opacities or effusions. No acute bony abnormality. IMPRESSION: No active cardiopulmonary disease. Electronically Signed   By: Charlett Nose M.D.   On: 06/30/2018 09:10   A total of 40 minutes was spent in the room with the patient, greater than 50% of which was in counseling/coordination of care regarding differential diagnosis, treatment, medications, blood pressure and heart disease, and need for cardiology follow-up.  ASSESSMENT &  PLAN: Aidian was seen today for chest pain.  Diagnoses and all orders for this visit:  Nonspecific chest pain -     CBC with Differential/Platelet -     Comprehensive metabolic panel -     Lipid panel -     EKG 12-Lead -     DG Chest 2 View; Future -     Ambulatory referral to Cardiology  Essential hypertension -     CBC with Differential/Platelet -     Comprehensive metabolic panel -     Lipid panel -     EKG 12-Lead -     DG Chest 2 View; Future -     lisinopril (PRINIVIL,ZESTRIL) 10 MG tablet; Take 1  tablet (10 mg total) by mouth daily.    Patient Instructions  Nonspecific Chest Pain Chest pain can be caused by many different conditions. There is a chance that your pain could be related to something serious, such as a heart attack or a blood clot in your lungs. Chest pain can also be caused by conditions that are not life-threatening. If you have chest pain, it is very important to follow up with your doctor. Follow these instructions at home: Medicines  If you were prescribed an antibiotic medicine, take it as told by your doctor. Do not stop taking the antibiotic even if you start to feel better.  Take over-the-counter and prescription medicines only as told by your doctor. Lifestyle  Do not use any products that contain nicotine or tobacco, such as cigarettes and e-cigarettes. If you need help quitting, ask your doctor.  Do not drink alcohol.  Make lifestyle changes as told by your doctor. These may include: ? Getting regular exercise. Ask your doctor for some activities that are safe for you. ? Eating a heart-healthy diet. A diet specialist (dietitian) can help you to learn healthy eating options. ? Staying at a healthy weight. ? Managing diabetes, if needed. ? Lowering your stress, as with deep breathing or spending time in nature. General instructions  Avoid any activities that make you feel chest pain.  If your chest pain is because of heartburn: ? Raise  (elevate) the head of your bed about 6 inches (15 cm). You can do this by putting blocks under the bed legs at the head of the bed. ? Do not sleep with extra pillows under your head. That does not help heartburn.  Keep all follow-up visits as told by your doctor. This is important. This includes any further testing if your chest pain does not go away. Contact a doctor if:  Your chest pain does not go away.  You have a rash with blisters on your chest.  You have a fever.  You have chills. Get help right away if:  Your chest pain is worse.  You have a cough that gets worse, or you cough up blood.  You have very bad (severe) pain in your belly (abdomen).  You are very weak.  You pass out (faint).  You have either of these for no clear reason: ? Sudden chest discomfort. ? Sudden discomfort in your arms, back, neck, or jaw.  You have shortness of breath at any time.  You suddenly start to sweat, or your skin gets clammy.  You feel sick to your stomach (nauseous).  You throw up (vomit).  You suddenly feel light-headed or dizzy.  Your heart starts to beat fast, or it feels like it is skipping beats. These symptoms may be an emergency. Do not wait to see if the symptoms will go away. Get medical help right away. Call your local emergency services (911 in the [place] Do not drive yourself to the hospital. This information is not intended to replace advice given to you by your health care provider. Make sure you discuss any questions you have with your health care provider. Document Released: 05/19/2008 Document Revised: 08/25/2016 Document Reviewed: 08/25/2016 Elsevier Interactive Patient Education  2017 Elsevier Inc.      Edwina Barth, MD [facility]

## 2018-07-01 ENCOUNTER — Ambulatory Visit: Payer: 59 | Admitting: Podiatry

## 2018-07-01 ENCOUNTER — Encounter: Payer: Self-pay | Admitting: Emergency Medicine

## 2018-11-24 ENCOUNTER — Ambulatory Visit: Payer: 59 | Admitting: Family Medicine

## 2019-05-28 ENCOUNTER — Other Ambulatory Visit: Payer: Self-pay | Admitting: *Deleted

## 2019-05-28 DIAGNOSIS — Z20822 Contact with and (suspected) exposure to covid-19: Secondary | ICD-10-CM

## 2019-05-30 LAB — NOVEL CORONAVIRUS, NAA: SARS-CoV-2, NAA: NOT DETECTED

## 2019-05-31 ENCOUNTER — Telehealth: Payer: Self-pay | Admitting: *Deleted

## 2019-05-31 NOTE — Telephone Encounter (Signed)
Attempted to call patient to give him the results of his covid-19 test. No answer, left message for him to call back at [phone] and ask to speak to a triage nurse.

## 2019-05-31 NOTE — Telephone Encounter (Signed)
Patient called for covid test results, results given as noted not detected, which means negative, you were not infected with the novel coronavirus, patient verbalized understanding.

## 2019-07-07 ENCOUNTER — Ambulatory Visit: Payer: 59 | Admitting: Emergency Medicine

## 2019-10-23 ENCOUNTER — Other Ambulatory Visit: Payer: Self-pay

## 2019-10-23 DIAGNOSIS — Z20822 Contact with and (suspected) exposure to covid-19: Secondary | ICD-10-CM

## 2019-10-24 LAB — NOVEL CORONAVIRUS, NAA: SARS-CoV-2, NAA: NOT DETECTED

## 2019-11-24 ENCOUNTER — Ambulatory Visit: Payer: 59 | Admitting: Emergency Medicine

## 2019-11-24 ENCOUNTER — Encounter: Payer: Self-pay | Admitting: Emergency Medicine

## 2019-11-24 ENCOUNTER — Other Ambulatory Visit: Payer: Self-pay

## 2019-11-24 VITALS — BP 184/101 | HR 88 | Temp 98.2°F | Resp 16 | Ht 72.0 in | Wt 185.2 lb

## 2019-11-24 DIAGNOSIS — I10 Essential (primary) hypertension: Secondary | ICD-10-CM | POA: Diagnosis not present

## 2019-11-24 MED ORDER — AMLODIPINE BESYLATE 5 MG PO TABS
5.0000 mg | ORAL_TABLET | Freq: Every day | ORAL | 3 refills | Status: DC
Start: 2019-11-24 — End: 2019-12-22

## 2019-11-24 MED ORDER — LISINOPRIL 20 MG PO TABS: 20.0000 mg | ORAL_TABLET | Freq: Every day | ORAL | 3 refills | Status: DC

## 2019-11-24 NOTE — Patient Instructions (Addendum)
   If you have lab work done today you will be contacted with your lab results within the next 2 weeks.  If you have not heard from us then please contact us. The fastest way to get your results is to register for My Chart.   IF you received an x-ray today, you will receive an invoice from [facility]. Please contact [facility] at [phone] with questions or concerns regarding your invoice.   IF you received labwork today, you will receive an invoice from [facility]. Please contact [facility] at [phone] with questions or concerns regarding your invoice.   Our billing staff will not be able to assist you with questions regarding bills from these companies.  You will be contacted with the lab results as soon as they are available. The fastest way to get your results is to activate your My Chart account. Instructions are located on the last page of this paperwork. If you have not heard from us regarding the results in 2 weeks, please contact this office.     Hypertension, Adult High blood pressure (hypertension) is when the force of blood pumping through the arteries is too strong. The arteries are the blood vessels that carry blood from the heart throughout the body. Hypertension forces the heart to work harder to pump blood and may cause arteries to become narrow or stiff. Untreated or uncontrolled hypertension can cause a heart attack, heart failure, a stroke, kidney disease, and other problems. A blood pressure reading consists of a higher number over a lower number. Ideally, your blood pressure should be below 120/80. The first ("top") number is called the systolic pressure. It is a measure of the pressure in your arteries as your heart beats. The second ("bottom") number is called the diastolic pressure. It is a measure of the pressure in your arteries as the heart relaxes. What are the causes? The exact cause of this condition is not known. There are some conditions  that result in or are related to high blood pressure. What increases the risk? Some risk factors for high blood pressure are under your control. The following factors may make you more likely to develop this condition:  Smoking.  Having type 2 diabetes mellitus, high cholesterol, or both.  Not getting enough exercise or physical activity.  Being overweight.  Having too much fat, sugar, calories, or salt (sodium) in your diet.  Drinking too much alcohol. Some risk factors for high blood pressure may be difficult or impossible to change. Some of these factors include:  Having chronic kidney disease.  Having a family history of high blood pressure.  Age. Risk increases with age.  Race. You may be at higher risk if you are African American.  Gender. Men are at higher risk than women before age 45. After age 65, women are at higher risk than men.  Having obstructive sleep apnea.  Stress. What are the signs or symptoms? High blood pressure may not cause symptoms. Very high blood pressure (hypertensive crisis) may cause:  Headache.  Anxiety.  Shortness of breath.  Nosebleed.  Nausea and vomiting.  Vision changes.  Severe chest pain.  Seizures. How is this diagnosed? This condition is diagnosed by measuring your blood pressure while you are seated, with your arm resting on a flat surface, your legs uncrossed, and your feet flat on the floor. The cuff of the blood pressure monitor will be placed directly against the skin of your upper arm at the level of your heart.   It should be measured at least twice using the same arm. Certain conditions can cause a difference in blood pressure between your right and left arms. Certain factors can cause blood pressure readings to be lower or higher than normal for a short period of time:  When your blood pressure is higher when you are in a health care provider's office than when you are at home, this is called white coat hypertension.  Most people with this condition do not need medicines.  When your blood pressure is higher at home than when you are in a health care provider's office, this is called masked hypertension. Most people with this condition may need medicines to control blood pressure. If you have a high blood pressure reading during one visit or you have normal blood pressure with other risk factors, you may be asked to:  Return on a different day to have your blood pressure checked again.  Monitor your blood pressure at home for 1 week or longer. If you are diagnosed with hypertension, you may have other blood or imaging tests to help your health care provider understand your overall risk for other conditions. How is this treated? This condition is treated by making healthy lifestyle changes, such as eating healthy foods, exercising more, and reducing your alcohol intake. Your health care provider may prescribe medicine if lifestyle changes are not enough to get your blood pressure under control, and if:  Your systolic blood pressure is above 130.  Your diastolic blood pressure is above 80. Your personal target blood pressure may vary depending on your medical conditions, your age, and other factors. Follow these instructions at home: Eating and drinking   Eat a diet that is high in fiber and potassium, and low in sodium, added sugar, and fat. An example eating plan is called the DASH (Dietary Approaches to Stop Hypertension) diet. To eat this way: ? Eat plenty of fresh fruits and vegetables. Try to fill one half of your plate at each meal with fruits and vegetables. ? Eat whole grains, such as whole-wheat pasta, brown rice, or whole-grain bread. Fill about one fourth of your plate with whole grains. ? Eat or drink low-fat dairy products, such as skim milk or low-fat yogurt. ? Avoid fatty cuts of meat, processed or cured meats, and poultry with skin. Fill about one fourth of your plate with lean proteins, such  as fish, chicken without skin, beans, eggs, or tofu. ? Avoid pre-made and processed foods. These tend to be higher in sodium, added sugar, and fat.  Reduce your daily sodium intake. Most people with hypertension should eat less than 1,500 mg of sodium a day.  Do not drink alcohol if: ? Your health care provider tells you not to drink. ? You are pregnant, may be pregnant, or are planning to become pregnant.  If you drink alcohol: ? Limit how much you use to:  0-1 drink a day for women.  0-2 drinks a day for men. ? Be aware of how much alcohol is in your drink. In the [place] one drink equals one 12 oz bottle of beer (355 mL), one 5 oz glass of wine (148 mL), or one 1 oz glass of hard liquor (44 mL). Lifestyle   Work with your health care provider to maintain a healthy body weight or to lose weight. Ask what an ideal weight is for you.  Get at least 30 minutes of exercise most days of the week. Activities may include walking, swimming, or   biking.  Include exercise to strengthen your muscles (resistance exercise), such as Pilates or lifting weights, as part of your weekly exercise routine. Try to do these types of exercises for 30 minutes at least 3 days a week.  Do not use any products that contain nicotine or tobacco, such as cigarettes, e-cigarettes, and chewing tobacco. If you need help quitting, ask your health care provider.  Monitor your blood pressure at home as told by your health care provider.  Keep all follow-up visits as told by your health care provider. This is important. Medicines  Take over-the-counter and prescription medicines only as told by your health care provider. Follow directions carefully. Blood pressure medicines must be taken as prescribed.  Do not skip doses of blood pressure medicine. Doing this puts you at risk for problems and can make the medicine less effective.  Ask your health care provider about side effects or reactions to medicines that you  should watch for. Contact a health care provider if you:  Think you are having a reaction to a medicine you are taking.  Have headaches that keep coming back (recurring).  Feel dizzy.  Have swelling in your ankles.  Have trouble with your vision. Get help right away if you:  Develop a severe headache or confusion.  Have unusual weakness or numbness.  Feel faint.  Have severe pain in your chest or abdomen.  Vomit repeatedly.  Have trouble breathing. Summary  Hypertension is when the force of blood pumping through your arteries is too strong. If this condition is not controlled, it may put you at risk for serious complications.  Your personal target blood pressure may vary depending on your medical conditions, your age, and other factors. For most people, a normal blood pressure is less than 120/80.  Hypertension is treated with lifestyle changes, medicines, or a combination of both. Lifestyle changes include losing weight, eating a healthy, low-sodium diet, exercising more, and limiting alcohol. This information is not intended to replace advice given to you by your health care provider. Make sure you discuss any questions you have with your health care provider. Document Released: 12/01/2005 Document Revised: 08/11/2018 Document Reviewed: 08/11/2018 Elsevier Patient Education  2020 [employer] Inc.  

## 2019-11-24 NOTE — Progress Notes (Signed)
BP Readings from Last 3 Encounters:  11/24/19 (!) 184/101  06/30/18 (!) 154/90  05/25/18 (!) 142/88   Travis Trevino 63 y.o.   Chief Complaint  Patient presents with   preop clearance    for dental cleanings    HISTORY OF PRESENT ILLNESS: This is a 63 y.o. male with history of hypertension, of medications for the past several months, seen yesterday at dentist's office and found to be very hypertensive.  Referred for clearance.  Asymptomatic at present time. Used to be on amlodipine 5 mg and lisinopril 10 mg daily.  HPI   Prior to Admission medications   Medication Sig Start Date End Date Taking? Authorizing Provider  amLODipine (NORVASC) 5 MG tablet Take 1 tablet (5 mg total) by mouth daily. Patient not taking: Reported on 11/24/2019 05/11/18   Travis Guys, MD  Travis Trevino 1.5 % LIQD Apply 1 application topically daily. Patient not taking: Reported on 11/24/2019 05/25/18   Travis Trevino, DPM  lisinopril (PRINIVIL,ZESTRIL) 10 MG tablet Take 1 tablet (10 mg total) by mouth daily. Patient not taking: Reported on 11/24/2019 06/30/18   Travis Pollen, MD  terbinafine (LAMISIL) 250 MG tablet Take 1 tablet (250 mg total) by mouth daily. Patient not taking: Reported on 11/24/2019 05/25/18   Travis Trevino, DPM    No Known Allergies  Patient Active Problem List   Diagnosis Date Noted   Nonspecific chest pain 06/30/2018   Essential hypertension 06/30/2018   RLS (restless legs syndrome) 11/15/2016    Past Medical History:  Diagnosis Date   Arthritis    Heart murmur     Past Surgical History:  Procedure Laterality Date   KNEE ARTHROSCOPY W/ MENISCAL REPAIR Left    06/2016    Social History   Socioeconomic History   Marital status: Married    Spouse name: Not on file   Number of children: Not on file   Years of education: Not on file   Highest education level: Not on file  Occupational History   Not on file  Tobacco Use   Smoking status:  Current Some Day Smoker   Smokeless tobacco: Never Used  Substance and Sexual Activity   Alcohol use: Yes    Comment: social   Drug use: Never   Sexual activity: Yes  Other Topics Concern   Not on file  Social History Narrative   Not on file   Social Determinants of Health   Financial Resource Strain:    Difficulty of Paying Living Expenses: Not on file  Food Insecurity:    Worried About Charity fundraiser in the Last Year: Not on file   [employer] of Food in the Last Year: Not on file  Transportation Needs:    Lack of Transportation (Medical): Not on file   Lack of Transportation (Non-Medical): Not on file  Physical Activity:    Days of Exercise per Week: Not on file   Minutes of Exercise per Session: Not on file  Stress:    Feeling of Stress : Not on file  Social Connections:    Frequency of Communication with Friends and Family: Not on file   Frequency of Social Gatherings with Friends and Family: Not on file   Attends Religious Services: Not on file   Active Member of Clubs or Organizations: Not on file   Attends Archivist Meetings: Not on file   Marital Status: Not on file  Intimate Partner Violence:    Fear  of Current or Ex-Partner: Not on file   Emotionally Abused: Not on file   Physically Abused: Not on file   Sexually Abused: Not on file    Family History  Problem Relation Age of Onset   Cancer Maternal Aunt      Review of Systems  Constitutional: Negative.  Negative for chills and fever.  HENT: Negative for congestion and sore throat.   Respiratory: Negative.  Negative for cough.   Cardiovascular: Negative.  Negative for chest pain and palpitations.  Gastrointestinal: Negative.  Negative for abdominal pain, diarrhea, nausea and vomiting.  Genitourinary: Negative.  Negative for dysuria and hematuria.  Skin: Negative.  Negative for rash.  Neurological: Negative for dizziness and headaches.  Endo/Heme/Allergies:  Negative.   All other systems reviewed and are negative.  Today's Vitals   11/24/19 1057  BP: (!) 184/101  Pulse: 88  Resp: 16  Temp: 98.2 F (36.8 C)  TempSrc: Oral  SpO2: 99%  Weight: 185 lb 3.2 oz (84 kg)  Height: 6' (1.829 m)   Body mass index is 25.12 kg/m.   Physical Exam Vitals reviewed.  Constitutional:      Appearance: Normal appearance.  HENT:     Head: Normocephalic.  Eyes:     Extraocular Movements: Extraocular movements intact.     Conjunctiva/sclera: Conjunctivae normal.     Pupils: Pupils are equal, round, and reactive to light.  Cardiovascular:     Rate and Rhythm: Normal rate and regular rhythm.     Pulses: Normal pulses.     Heart sounds: Normal heart sounds.  Pulmonary:     Effort: Pulmonary effort is normal.     Breath sounds: Normal breath sounds.  Abdominal:     Palpations: Abdomen is soft.     Tenderness: There is no abdominal tenderness.  Musculoskeletal:        General: Normal range of motion.     Cervical back: Normal range of motion and neck supple.  Skin:    General: Skin is warm and dry.     Capillary Refill: Capillary refill takes less than 2 seconds.  Neurological:     General: No focal deficit present.     Mental Status: He is alert and oriented to person, place, and time.  Psychiatric:        Mood and Affect: Mood normal.        Behavior: Behavior normal.    A total of 25 minutes was spent in the room with the patient, greater than 50% of which was in counseling/coordination of care regarding hypertension and cardiovascular risks associated with it, need for blood work, need for medication, medication side effects, diet and nutrition, prognosis and need for follow-up in 4 weeks.   ASSESSMENT & PLAN: Travis Trevino was seen today for preop clearance.  Diagnoses and all orders for this visit:  Uncontrolled hypertension -     CBC with Differential -     Comprehensive metabolic panel -     Lipid panel -     amLODipine (NORVASC) 5  MG tablet; Take 1 tablet (5 mg total) by mouth daily. -     lisinopril (ZESTRIL) 20 MG tablet; Take 1 tablet (20 mg total) by mouth daily.    Patient Instructions       If you have lab work done today you will be contacted with your lab results within the next 2 weeks.  If you have not heard from [place] then please contact us. The fastest way to  get your results is to register for My Chart.   IF you received an x-ray today, you will receive an invoice from [facility]. Please contact [facility] at [phone] with questions or concerns regarding your invoice.   IF you received labwork today, you will receive an invoice from [facility]. Please contact [facility] at [phone] with questions or concerns regarding your invoice.   Our billing staff will not be able to assist you with questions regarding bills from these companies.  You will be contacted with the lab results as soon as they are available. The fastest way to get your results is to activate your My Chart account. Instructions are located on the last page of this paperwork. If you have not heard from [place] regarding the results in 2 weeks, please contact this office.     Hypertension, Adult High blood pressure (hypertension) is when the force of blood pumping through the arteries is too strong. The arteries are the blood vessels that carry blood from the heart throughout the body. Hypertension forces the heart to work harder to pump blood and may cause arteries to become narrow or stiff. Untreated or uncontrolled hypertension can cause a heart attack, heart failure, a stroke, kidney disease, and other problems. A blood pressure reading consists of a higher number over a lower number. Ideally, your blood pressure should be below 120/80. The first ("top") number is called the systolic pressure. It is a measure of the pressure in your arteries as your heart beats. The second ("bottom") number is called the diastolic  pressure. It is a measure of the pressure in your arteries as the heart relaxes. What are the causes? The exact cause of this condition is not known. There are some conditions that result in or are related to high blood pressure. What increases the risk? Some risk factors for high blood pressure are under your control. The following factors may make you more likely to develop this condition:  Smoking.  Having type 2 diabetes mellitus, high cholesterol, or both.  Not getting enough exercise or physical activity.  Being overweight.  Having too much fat, sugar, calories, or salt (sodium) in your diet.  Drinking too much alcohol. Some risk factors for high blood pressure may be difficult or impossible to change. Some of these factors include:  Having chronic kidney disease.  Having a family history of high blood pressure.  Age. Risk increases with age.  Race. You may be at higher risk if you are African American.  Gender. Men are at higher risk than women before age 72. After age 99, women are at higher risk than men.  Having obstructive sleep apnea.  Stress. What are the signs or symptoms? High blood pressure may not cause symptoms. Very high blood pressure (hypertensive crisis) may cause:  Headache.  Anxiety.  Shortness of breath.  Nosebleed.  Nausea and vomiting.  Vision changes.  Severe chest pain.  Seizures. How is this diagnosed? This condition is diagnosed by measuring your blood pressure while you are seated, with your arm resting on a flat surface, your legs uncrossed, and your feet flat on the floor. The cuff of the blood pressure monitor will be placed directly against the skin of your upper arm at the level of your heart. It should be measured at least twice using the same arm. Certain conditions can cause a difference in blood pressure between your right and left arms. Certain factors can cause blood pressure readings to be lower or higher than  normal for  a short period of time:  When your blood pressure is higher when you are in a health care provider's office than when you are at home, this is called white coat hypertension. Most people with this condition do not need medicines.  When your blood pressure is higher at home than when you are in a health care provider's office, this is called masked hypertension. Most people with this condition may need medicines to control blood pressure. If you have a high blood pressure reading during one visit or you have normal blood pressure with other risk factors, you may be asked to:  Return on a different day to have your blood pressure checked again.  Monitor your blood pressure at home for 1 week or longer. If you are diagnosed with hypertension, you may have other blood or imaging tests to help your health care provider understand your overall risk for other conditions. How is this treated? This condition is treated by making healthy lifestyle changes, such as eating healthy foods, exercising more, and reducing your alcohol intake. Your health care provider may prescribe medicine if lifestyle changes are not enough to get your blood pressure under control, and if:  Your systolic blood pressure is above 130.  Your diastolic blood pressure is above 80. Your personal target blood pressure may vary depending on your medical conditions, your age, and other factors. Follow these instructions at home: Eating and drinking   Eat a diet that is high in fiber and potassium, and low in sodium, added sugar, and fat. An example eating plan is called the DASH (Dietary Approaches to Stop Hypertension) diet. To eat this way: ? Eat plenty of fresh fruits and vegetables. Try to fill one half of your plate at each meal with fruits and vegetables. ? Eat whole grains, such as whole-wheat pasta, brown rice, or whole-grain bread. Fill about one fourth of your plate with whole grains. ? Eat or drink low-fat dairy  products, such as skim milk or low-fat yogurt. ? Avoid fatty cuts of meat, processed or cured meats, and poultry with skin. Fill about one fourth of your plate with lean proteins, such as fish, chicken without skin, beans, eggs, or tofu. ? Avoid pre-made and processed foods. These tend to be higher in sodium, added sugar, and fat.  Reduce your daily sodium intake. Most people with hypertension should eat less than 1,500 mg of sodium a day.  Do not drink alcohol if: ? Your health care provider tells you not to drink. ? You are pregnant, may be pregnant, or are planning to become pregnant.  If you drink alcohol: ? Limit how much you use to:  0-1 drink a day for women.  0-2 drinks a day for men. ? Be aware of how much alcohol is in your drink. In the [place] one drink equals one 12 oz bottle of beer (355 mL), one 5 oz glass of wine (148 mL), or one 1 oz glass of hard liquor (44 mL). Lifestyle   Work with your health care provider to maintain a healthy body weight or to lose weight. Ask what an ideal weight is for you.  Get at least 30 minutes of exercise most days of the week. Activities may include walking, swimming, or biking.  Include exercise to strengthen your muscles (resistance exercise), such as Pilates or lifting weights, as part of your weekly exercise routine. Try to do these types of exercises for 30 minutes at least 3 days a week.  Do not use any products that contain nicotine or tobacco, such as cigarettes, e-cigarettes, and chewing tobacco. If you need help quitting, ask your health care provider.  Monitor your blood pressure at home as told by your health care provider.  Keep all follow-up visits as told by your health care provider. This is important. Medicines  Take over-the-counter and prescription medicines only as told by your health care provider. Follow directions carefully. Blood pressure medicines must be taken as prescribed.  Do not skip doses of blood  pressure medicine. Doing this puts you at risk for problems and can make the medicine less effective.  Ask your health care provider about side effects or reactions to medicines that you should watch for. Contact a health care provider if you:  Think you are having a reaction to a medicine you are taking.  Have headaches that keep coming back (recurring).  Feel dizzy.  Have swelling in your ankles.  Have trouble with your vision. Get help right away if you:  Develop a severe headache or confusion.  Have unusual weakness or numbness.  Feel faint.  Have severe pain in your chest or abdomen.  Vomit repeatedly.  Have trouble breathing. Summary  Hypertension is when the force of blood pumping through your arteries is too strong. If this condition is not controlled, it may put you at risk for serious complications.  Your personal target blood pressure may vary depending on your medical conditions, your age, and other factors. For most people, a normal blood pressure is less than 120/80.  Hypertension is treated with lifestyle changes, medicines, or a combination of both. Lifestyle changes include losing weight, eating a healthy, low-sodium diet, exercising more, and limiting alcohol. This information is not intended to replace advice given to you by your health care provider. Make sure you discuss any questions you have with your health care provider. Document Released: 12/01/2005 Document Revised: 08/11/2018 Document Reviewed: 08/11/2018 Elsevier Patient Education  2020 [employer]      Edwina Barth, MD [facility]

## 2019-11-25 LAB — LIPID PANEL
Chol/HDL Ratio: 4.9 ratio (ref 0.0–5.0)
Cholesterol, Total: 196 mg/dL (ref 100–199)
HDL: 40 mg/dL (ref >39)
LDL Chol Calc (NIH): 129 mg/dL — ABNORMAL HIGH (ref 0–99)
Triglycerides: 152 mg/dL — ABNORMAL HIGH (ref 0–149)
VLDL Cholesterol Cal: 27 mg/dL (ref 5–40)

## 2019-11-25 LAB — CBC WITH DIFFERENTIAL/PLATELET
Basophils Absolute: 0.1 10*3/uL (ref 0.0–0.2)
Basos: 1 %
EOS (ABSOLUTE): 0.1 10*3/uL (ref 0.0–0.4)
Eos: 1 %
Hematocrit: 41.4 % (ref 37.5–51.0)
Hemoglobin: 13.9 g/dL (ref 13.0–17.7)
Immature Grans (Abs): 0 10*3/uL (ref 0.0–0.1)
Immature Granulocytes: 0 %
Lymphocytes Absolute: 1.8 10*3/uL (ref 0.7–3.1)
Lymphs: 29 %
MCH: 30.5 pg (ref 26.6–33.0)
MCHC: 33.6 g/dL (ref 31.5–35.7)
MCV: 91 fL (ref 79–97)
Monocytes Absolute: 0.6 10*3/uL (ref 0.1–0.9)
Monocytes: 9 %
Neutrophils Absolute: 3.7 10*3/uL (ref 1.4–7.0)
Neutrophils: 60 %
Platelets: 382 10*3/uL (ref 150–450)
RBC: 4.55 x10E6/uL (ref 4.14–5.80)
RDW: 12.5 % (ref 11.6–15.4)
WBC: 6.2 10*3/uL (ref 3.4–10.8)

## 2019-11-25 LAB — COMPREHENSIVE METABOLIC PANEL
ALT: 31 IU/L (ref 0–44)
AST: 19 IU/L (ref 0–40)
Albumin/Globulin Ratio: 1.3 (ref 1.2–2.2)
Albumin: 3.8 g/dL (ref 3.8–4.8)
Alkaline Phosphatase: 80 IU/L (ref 39–117)
BUN/Creatinine Ratio: 10 (ref 10–24)
BUN: 11 mg/dL (ref 8–27)
Bilirubin Total: 0.5 mg/dL (ref 0.0–1.2)
CO2: 20 mmol/L (ref 20–29)
Calcium: 9.2 mg/dL (ref 8.6–10.2)
Chloride: 106 mmol/L (ref 96–106)
Creatinine, Ser: 1.14 mg/dL (ref 0.76–1.27)
GFR calc Af Amer: 79 mL/min/{1.73_m2} (ref >59)
GFR calc non Af Amer: 68 mL/min/{1.73_m2} (ref >59)
Globulin, Total: 3 g/dL (ref 1.5–4.5)
Glucose: 167 mg/dL — ABNORMAL HIGH (ref 65–99)
Potassium: 3.6 mmol/L (ref 3.5–5.2)
Sodium: 138 mmol/L (ref 134–144)
Total Protein: 6.8 g/dL (ref 6.0–8.5)

## 2019-11-27 ENCOUNTER — Encounter: Payer: Self-pay | Admitting: Emergency Medicine

## 2019-12-22 ENCOUNTER — Encounter: Payer: Self-pay | Admitting: Emergency Medicine

## 2019-12-22 ENCOUNTER — Ambulatory Visit: Payer: 59 | Admitting: Emergency Medicine

## 2019-12-22 ENCOUNTER — Other Ambulatory Visit: Payer: Self-pay

## 2019-12-22 VITALS — BP 134/82 | HR 72 | Temp 98.0°F | Resp 16 | Ht 72.0 in | Wt 181.0 lb

## 2019-12-22 DIAGNOSIS — E785 Hyperlipidemia, unspecified: Secondary | ICD-10-CM

## 2019-12-22 DIAGNOSIS — R7303 Prediabetes: Secondary | ICD-10-CM

## 2019-12-22 DIAGNOSIS — I10 Essential (primary) hypertension: Secondary | ICD-10-CM | POA: Diagnosis not present

## 2019-12-22 DIAGNOSIS — R739 Hyperglycemia, unspecified: Secondary | ICD-10-CM

## 2019-12-22 LAB — POCT GLYCOSYLATED HEMOGLOBIN (HGB A1C): Hemoglobin A1C: 6 % — AB (ref 4.0–5.6)

## 2019-12-22 LAB — GLUCOSE, POCT (MANUAL RESULT ENTRY): POC Glucose: 78 mg/dl (ref 70–99)

## 2019-12-22 MED ORDER — LISINOPRIL 20 MG PO TABS: 20.0000 mg | ORAL_TABLET | Freq: Every day | ORAL | 3 refills | Status: DC

## 2019-12-22 MED ORDER — AMLODIPINE BESYLATE 5 MG PO TABS: 5.0000 mg | ORAL_TABLET | Freq: Every day | ORAL | 3 refills | Status: DC

## 2019-12-22 NOTE — Patient Instructions (Addendum)
   If you have lab work done today you will be contacted with your lab results within the next 2 weeks.  If you have not heard from us then please contact us. The fastest way to get your results is to register for My Chart.   IF you received an x-ray today, you will receive an invoice from [facility]. Please contact  Radiology at [phone] with questions or concerns regarding your invoice.   IF you received labwork today, you will receive an invoice from [facility]. Please contact [facility] at [phone] with questions or concerns regarding your invoice.   Our billing staff will not be able to assist you with questions regarding bills from these companies.  You will be contacted with the lab results as soon as they are available. The fastest way to get your results is to activate your My Chart account. Instructions are located on the last page of this paperwork. If you have not heard from us regarding the results in 2 weeks, please contact this office.      DASH Eating Plan DASH stands for "Dietary Approaches to Stop Hypertension." The DASH eating plan is a healthy eating plan that has been shown to reduce high blood pressure (hypertension). It may also reduce your risk for type 2 diabetes, heart disease, and stroke. The DASH eating plan may also help with weight loss. What are tips for following this plan?  General guidelines  Avoid eating more than 2,300 mg (milligrams) of salt (sodium) a day. If you have hypertension, you may need to reduce your sodium intake to 1,500 mg a day.  Limit alcohol intake to no more than 1 drink a day for nonpregnant women and 2 drinks a day for men. One drink equals 12 oz of beer, 5 oz of wine, or 1 oz of hard liquor.  Work with your health care provider to maintain a healthy body weight or to lose weight. Ask what an ideal weight is for you.  Get at least 30 minutes of exercise that causes your heart to beat faster (aerobic  exercise) most days of the week. Activities may include walking, swimming, or biking.  Work with your health care provider or diet and nutrition specialist (dietitian) to adjust your eating plan to your individual calorie needs. Reading food labels   Check food labels for the amount of sodium per serving. Choose foods with less than 5 percent of the Daily Value of sodium. Generally, foods with less than 300 mg of sodium per serving fit into this eating plan.  To find whole grains, look for the word "whole" as the first word in the ingredient list. Shopping  Buy products labeled as "low-sodium" or "no salt added."  Buy fresh foods. Avoid canned foods and premade or frozen meals. Cooking  Avoid adding salt when cooking. Use salt-free seasonings or herbs instead of table salt or sea salt. Check with your health care provider or pharmacist before using salt substitutes.  Do not fry foods. Cook foods using healthy methods such as baking, boiling, grilling, and broiling instead.  Cook with heart-healthy oils, such as olive, canola, soybean, or sunflower oil. Meal planning  Eat a balanced diet that includes: ? 5 or more servings of fruits and vegetables each day. At each meal, try to fill half of your plate with fruits and vegetables. ? Up to 6-8 servings of whole grains each day. ? Less than 6 oz of lean meat, poultry, or fish each day. A 3-oz   serving of meat is about the same size as a deck of cards. One egg equals 1 oz. ? 2 servings of low-fat dairy each day. ? A serving of nuts, seeds, or beans 5 times each week. ? Heart-healthy fats. Healthy fats called Omega-3 fatty acids are found in foods such as flaxseeds and coldwater fish, like sardines, salmon, and mackerel.  Limit how much you eat of the following: ? Canned or prepackaged foods. ? Food that is high in trans fat, such as fried foods. ? Food that is high in saturated fat, such as fatty meat. ? Sweets, desserts, sugary drinks,  and other foods with added sugar. ? Full-fat dairy products.  Do not salt foods before eating.  Try to eat at least 2 vegetarian meals each week.  Eat more home-cooked food and less restaurant, buffet, and fast food.  When eating at a restaurant, ask that your food be prepared with less salt or no salt, if possible. What foods are recommended? The items listed may not be a complete list. Talk with your dietitian about what dietary choices are best for you. Grains Whole-grain or whole-wheat bread. Whole-grain or whole-wheat pasta. Brown rice. Oatmeal. Quinoa. Bulgur. Whole-grain and low-sodium cereals. Pita bread. Low-fat, low-sodium crackers. Whole-wheat flour tortillas. Vegetables Fresh or frozen vegetables (raw, steamed, roasted, or grilled). Low-sodium or reduced-sodium tomato and vegetable juice. Low-sodium or reduced-sodium tomato sauce and tomato paste. Low-sodium or reduced-sodium canned vegetables. Fruits All fresh, dried, or frozen fruit. Canned fruit in natural juice (without added sugar). Meat and other protein foods Skinless chicken or turkey. Ground chicken or turkey. Pork with fat trimmed off. Fish and seafood. Egg whites. Dried beans, peas, or lentils. Unsalted nuts, nut butters, and seeds. Unsalted canned beans. Lean cuts of beef with fat trimmed off. Low-sodium, lean deli meat. Dairy Low-fat (1%) or fat-free (skim) milk. Fat-free, low-fat, or reduced-fat cheeses. Nonfat, low-sodium ricotta or cottage cheese. Low-fat or nonfat yogurt. Low-fat, low-sodium cheese. Fats and oils Soft margarine without trans fats. Vegetable oil. Low-fat, reduced-fat, or light mayonnaise and salad dressings (reduced-sodium). Canola, safflower, olive, soybean, and sunflower oils. Avocado. Seasoning and other foods Herbs. Spices. Seasoning mixes without salt. Unsalted popcorn and pretzels. Fat-free sweets. What foods are not recommended? The items listed may not be a complete list. Talk with your  dietitian about what dietary choices are best for you. Grains Baked goods made with fat, such as croissants, muffins, or some breads. Dry pasta or rice meal packs. Vegetables Creamed or fried vegetables. Vegetables in a cheese sauce. Regular canned vegetables (not low-sodium or reduced-sodium). Regular canned tomato sauce and paste (not low-sodium or reduced-sodium). Regular tomato and vegetable juice (not low-sodium or reduced-sodium). Pickles. Olives. Fruits Canned fruit in a light or heavy syrup. Fried fruit. Fruit in cream or butter sauce. Meat and other protein foods Fatty cuts of meat. Ribs. Fried meat. Bacon. Sausage. Bologna and other processed lunch meats. Salami. Fatback. Hotdogs. Bratwurst. Salted nuts and seeds. Canned beans with added salt. Canned or smoked fish. Whole eggs or egg yolks. Chicken or turkey with skin. Dairy Whole or 2% milk, cream, and half-and-half. Whole or full-fat cream cheese. Whole-fat or sweetened yogurt. Full-fat cheese. Nondairy creamers. Whipped toppings. Processed cheese and cheese spreads. Fats and oils Butter. Stick margarine. Lard. Shortening. Ghee. Bacon fat. Tropical oils, such as coconut, palm kernel, or palm oil. Seasoning and other foods Salted popcorn and pretzels. Onion salt, garlic salt, seasoned salt, table salt, and sea salt. Worcestershire sauce. Tartar sauce. Barbecue   sauce. Teriyaki sauce. Soy sauce, including reduced-sodium. Steak sauce. Canned and packaged gravies. Fish sauce. Oyster sauce. Cocktail sauce. Horseradish that you find on the shelf. Ketchup. Mustard. Meat flavorings and tenderizers. Bouillon cubes. Hot sauce and Tabasco sauce. Premade or packaged marinades. Premade or packaged taco seasonings. Relishes. Regular salad dressings. Where to find more information:  National Heart, Lung, and Blood Institute: www.nhlbi.nih.gov  American Heart Association: www.heart.org Summary  The DASH eating plan is a healthy eating plan that has  been shown to reduce high blood pressure (hypertension). It may also reduce your risk for type 2 diabetes, heart disease, and stroke.  With the DASH eating plan, you should limit salt (sodium) intake to 2,300 mg a day. If you have hypertension, you may need to reduce your sodium intake to 1,500 mg a day.  When on the DASH eating plan, aim to eat more fresh fruits and vegetables, whole grains, lean proteins, low-fat dairy, and heart-healthy fats.  Work with your health care provider or diet and nutrition specialist (dietitian) to adjust your eating plan to your individual calorie needs. This information is not intended to replace advice given to you by your health care provider. Make sure you discuss any questions you have with your health care provider. Document Revised: 11/13/2017 Document Reviewed: 11/24/2016 Elsevier Patient Education  2020 [employer]  Hypertension, Adult High blood pressure (hypertension) is when the force of blood pumping through the arteries is too strong. The arteries are the blood vessels that carry blood from the heart throughout the body. Hypertension forces the heart to work harder to pump blood and may cause arteries to become narrow or stiff. Untreated or uncontrolled hypertension can cause a heart attack, heart failure, a stroke, kidney disease, and other problems. A blood pressure reading consists of a higher number over a lower number. Ideally, your blood pressure should be below 120/80. The first ("top") number is called the systolic pressure. It is a measure of the pressure in your arteries as your heart beats. The second ("bottom") number is called the diastolic pressure. It is a measure of the pressure in your arteries as the heart relaxes. What are the causes? The exact cause of this condition is not known. There are some conditions that result in or are related to high blood pressure. What increases the risk? Some risk factors for high blood pressure are  under your control. The following factors may make you more likely to develop this condition:  Smoking.  Having type 2 diabetes mellitus, high cholesterol, or both.  Not getting enough exercise or physical activity.  Being overweight.  Having too much fat, sugar, calories, or salt (sodium) in your diet.  Drinking too much alcohol. Some risk factors for high blood pressure may be difficult or impossible to change. Some of these factors include:  Having chronic kidney disease.  Having a family history of high blood pressure.  Age. Risk increases with age.  Race. You may be at higher risk if you are African American.  Gender. Men are at higher risk than women before age 45. After age 65, women are at higher risk than men.  Having obstructive sleep apnea.  Stress. What are the signs or symptoms? High blood pressure may not cause symptoms. Very high blood pressure (hypertensive crisis) may cause:  Headache.  Anxiety.  Shortness of breath.  Nosebleed.  Nausea and vomiting.  Vision changes.  Severe chest pain.  Seizures. How is this diagnosed? This condition is diagnosed by measuring your   blood pressure while you are seated, with your arm resting on a flat surface, your legs uncrossed, and your feet flat on the floor. The cuff of the blood pressure monitor will be placed directly against the skin of your upper arm at the level of your heart. It should be measured at least twice using the same arm. Certain conditions can cause a difference in blood pressure between your right and left arms. Certain factors can cause blood pressure readings to be lower or higher than normal for a short period of time:  When your blood pressure is higher when you are in a health care provider's office than when you are at home, this is called white coat hypertension. Most people with this condition do not need medicines.  When your blood pressure is higher at home than when you are in a  health care provider's office, this is called masked hypertension. Most people with this condition may need medicines to control blood pressure. If you have a high blood pressure reading during one visit or you have normal blood pressure with other risk factors, you may be asked to:  Return on a different day to have your blood pressure checked again.  Monitor your blood pressure at home for 1 week or longer. If you are diagnosed with hypertension, you may have other blood or imaging tests to help your health care provider understand your overall risk for other conditions. How is this treated? This condition is treated by making healthy lifestyle changes, such as eating healthy foods, exercising more, and reducing your alcohol intake. Your health care provider may prescribe medicine if lifestyle changes are not enough to get your blood pressure under control, and if:  Your systolic blood pressure is above 130.  Your diastolic blood pressure is above 80. Your personal target blood pressure may vary depending on your medical conditions, your age, and other factors. Follow these instructions at home: Eating and drinking   Eat a diet that is high in fiber and potassium, and low in sodium, added sugar, and fat. An example eating plan is called the DASH (Dietary Approaches to Stop Hypertension) diet. To eat this way: ? Eat plenty of fresh fruits and vegetables. Try to fill one half of your plate at each meal with fruits and vegetables. ? Eat whole grains, such as whole-wheat pasta, brown rice, or whole-grain bread. Fill about one fourth of your plate with whole grains. ? Eat or drink low-fat dairy products, such as skim milk or low-fat yogurt. ? Avoid fatty cuts of meat, processed or cured meats, and poultry with skin. Fill about one fourth of your plate with lean proteins, such as fish, chicken without skin, beans, eggs, or tofu. ? Avoid pre-made and processed foods. These tend to be higher in  sodium, added sugar, and fat.  Reduce your daily sodium intake. Most people with hypertension should eat less than 1,500 mg of sodium a day.  Do not drink alcohol if: ? Your health care provider tells you not to drink. ? You are pregnant, may be pregnant, or are planning to become pregnant.  If you drink alcohol: ? Limit how much you use to:  0-1 drink a day for women.  0-2 drinks a day for men. ? Be aware of how much alcohol is in your drink. In the [place] one drink equals one 12 oz bottle of beer (355 mL), one 5 oz glass of wine (148 mL), or one 1 oz glass of hard liquor (  44 mL). Lifestyle   Work with your health care provider to maintain a healthy body weight or to lose weight. Ask what an ideal weight is for you.  Get at least 30 minutes of exercise most days of the week. Activities may include walking, swimming, or biking.  Include exercise to strengthen your muscles (resistance exercise), such as Pilates or lifting weights, as part of your weekly exercise routine. Try to do these types of exercises for 30 minutes at least 3 days a week.  Do not use any products that contain nicotine or tobacco, such as cigarettes, e-cigarettes, and chewing tobacco. If you need help quitting, ask your health care provider.  Monitor your blood pressure at home as told by your health care provider.  Keep all follow-up visits as told by your health care provider. This is important. Medicines  Take over-the-counter and prescription medicines only as told by your health care provider. Follow directions carefully. Blood pressure medicines must be taken as prescribed.  Do not skip doses of blood pressure medicine. Doing this puts you at risk for problems and can make the medicine less effective.  Ask your health care provider about side effects or reactions to medicines that you should watch for. Contact a health care provider if you:  Think you are having a reaction to a medicine you are  taking.  Have headaches that keep coming back (recurring).  Feel dizzy.  Have swelling in your ankles.  Have trouble with your vision. Get help right away if you:  Develop a severe headache or confusion.  Have unusual weakness or numbness.  Feel faint.  Have severe pain in your chest or abdomen.  Vomit repeatedly.  Have trouble breathing. Summary  Hypertension is when the force of blood pumping through your arteries is too strong. If this condition is not controlled, it may put you at risk for serious complications.  Your personal target blood pressure may vary depending on your medical conditions, your age, and other factors. For most people, a normal blood pressure is less than 120/80.  Hypertension is treated with lifestyle changes, medicines, or a combination of both. Lifestyle changes include losing weight, eating a healthy, low-sodium diet, exercising more, and limiting alcohol. This information is not intended to replace advice given to you by your health care provider. Make sure you discuss any questions you have with your health care provider. Document Revised: 08/11/2018 Document Reviewed: 08/11/2018 Elsevier Patient Education  2020 [employer].  

## 2019-12-22 NOTE — Progress Notes (Addendum)
BP Readings from Last 3 Encounters:  12/22/19 134/82  11/24/19 (!) 184/101  06/30/18 (!) 154/90   Julieanne Manson Cumpian 64 y.o.   Chief Complaint  Patient presents with  . Medication Refill    amlodipine and Lisinopril  . Pre-op Exam    Dental Surgery     HISTORY OF PRESENT ILLNESS: This is a 64 y.o. male seen by me 1 month ago with uncontrolled hypertension.  Here for follow-up.  Doing well.  Blood pressure readings at home within normal limits.  Presently on amlodipine and lisinopril.  Needs medical clearance for dental procedures. Recent blood work showed abnormal lipid profile and hyperglycemia.  Fasting today.  Will repeat lipid profile along with A1c to rule out diabetes. Feels better.  No complaints or medical concerns today.  HPI   Prior to Admission medications   Medication Sig Start Date End Date Taking? Authorizing Provider  amLODipine (NORVASC) 5 MG tablet Take 1 tablet (5 mg total) by mouth daily. 12/22/19 03/21/20 Yes Donold Marotto, Eilleen Kempf, MD  lisinopril (ZESTRIL) 20 MG tablet Take 1 tablet (20 mg total) by mouth daily. 12/22/19  Yes Citlaly Camplin, Eilleen Kempf, MD  Robyne Askew Paint 1.5 % LIQD Apply 1 application topically daily. Patient not taking: Reported on 11/24/2019 05/25/18   Hyatt, Max T, DPM  terbinafine (LAMISIL) 250 MG tablet Take 1 tablet (250 mg total) by mouth daily. Patient not taking: Reported on 11/24/2019 05/25/18   Elinor Parkinson, DPM    No Known Allergies  Patient Active Problem List   Diagnosis Date Noted  . Essential hypertension 06/30/2018  . RLS (restless legs syndrome) 11/15/2016    Past Medical History:  Diagnosis Date  . Arthritis   . Heart murmur     Past Surgical History:  Procedure Laterality Date  . KNEE ARTHROSCOPY W/ MENISCAL REPAIR Left    06/2016    Social History   Socioeconomic History  . Marital status: Married    Spouse name: Not on file  . Number of children: Not on file  . Years of education: Not on file  . Highest  education level: Not on file  Occupational History  . Not on file  Tobacco Use  . Smoking status: Current Some Day Smoker  . Smokeless tobacco: Never Used  Substance and Sexual Activity  . Alcohol use: Yes    Comment: social  . Drug use: Never  . Sexual activity: Yes  Other Topics Concern  . Not on file  Social History Narrative  . Not on file   Social Determinants of Health   Financial Resource Strain:   . Difficulty of Paying Living Expenses: Not on file  Food Insecurity:   . Worried About Programme researcher, broadcasting/film/video in the Last Year: Not on file  . Ran Out of Food in the Last Year: Not on file  Transportation Needs:   . Lack of Transportation (Medical): Not on file  . Lack of Transportation (Non-Medical): Not on file  Physical Activity:   . Days of Exercise per Week: Not on file  . Minutes of Exercise per Session: Not on file  Stress:   . Feeling of Stress : Not on file  Social Connections:   . Frequency of Communication with Friends and Family: Not on file  . Frequency of Social Gatherings with Friends and Family: Not on file  . Attends Religious Services: Not on file  . Active Member of Clubs or Organizations: Not on file  . Attends Banker  Meetings: Not on file  . Marital Status: Not on file  Intimate Partner Violence:   . Fear of Current or Ex-Partner: Not on file  . Emotionally Abused: Not on file  . Physically Abused: Not on file  . Sexually Abused: Not on file    Family History  Problem Relation Age of Onset  . Cancer Maternal Aunt      Review of Systems  Constitutional: Negative.  Negative for fever.  HENT: Negative.  Negative for congestion and sore throat.   Eyes: Negative.  Negative for blurred vision and double vision.  Respiratory: Negative.  Negative for cough and shortness of breath.   Cardiovascular: Negative.  Negative for chest pain and palpitations.  Gastrointestinal: Negative.  Negative for abdominal pain, nausea and vomiting.    Genitourinary: Negative for dysuria and hematuria.  Musculoskeletal: Negative.  Negative for myalgias and neck pain.  Skin: Negative.  Negative for rash.  Neurological: Negative.  Negative for dizziness and headaches.  Endo/Heme/Allergies: Negative.   All other systems reviewed and are negative.  Today's Vitals   12/22/19 0900  BP: 134/82  Pulse: 72  Resp: 16  Temp: 98 F (36.7 C)  TempSrc: Temporal  SpO2: 100%  Weight: 181 lb (82.1 kg)  Height: 6' (1.829 m)   Body mass index is 24.55 kg/m.   Physical Exam Vitals reviewed.  Constitutional:      Appearance: Normal appearance.  HENT:     Head: Normocephalic.  Eyes:     Extraocular Movements: Extraocular movements intact.     Pupils: Pupils are equal, round, and reactive to light.  Cardiovascular:     Rate and Rhythm: Normal rate and regular rhythm.     Pulses: Normal pulses.     Heart sounds: Normal heart sounds.  Pulmonary:     Effort: Pulmonary effort is normal.     Breath sounds: Normal breath sounds.  Musculoskeletal:        General: Normal range of motion.     Cervical back: Normal range of motion.  Skin:    General: Skin is warm and dry.     Capillary Refill: Capillary refill takes less than 2 seconds.  Neurological:     General: No focal deficit present.     Mental Status: He is alert and oriented to person, place, and time.  Psychiatric:        Mood and Affect: Mood normal.    Results for orders placed or performed in visit on 12/22/19 (from the past 24 hour(s))  POCT glucose (manual entry)     Status: None   Collection Time: 12/22/19  9:30 AM  Result Value Ref Range   POC Glucose 78 70 - 99 mg/dl  POCT glycosylated hemoglobin (Hb A1C)     Status: Abnormal   Collection Time: 12/22/19  9:37 AM  Result Value Ref Range   Hemoglobin A1C 6.0 (A) 4.0 - 5.6 %   HbA1c POC (<> result, manual entry)     HbA1c, POC (prediabetic range)     HbA1c, POC (controlled diabetic range)     A total of 25 minutes  was spent in the room with the patient, greater than 50% of which was in counseling/coordination of care regarding hypertension and cardiovascular risk associated with, management including medication and side effects, diet and nutrition, review of most recent blood work including today's, prognosis and need for follow-up Medically clear for dental work.   ASSESSMENT & PLAN: Essential hypertension Well-controlled hypertension.  Continue present medications.  No changes.  Follow-up in 6 months.  Medically clear for dental work.  Alden Serverrnest was seen today for medication refill and pre-op exam.  Diagnoses and all orders for this visit:  Essential hypertension -     amLODipine (NORVASC) 5 MG tablet; Take 1 tablet (5 mg total) by mouth daily. -     lisinopril (ZESTRIL) 20 MG tablet; Take 1 tablet (20 mg total) by mouth daily.  Hyperglycemia -     POCT glucose (manual entry) -     POCT glycosylated hemoglobin (Hb A1C)  Dyslipidemia -     Lipid panel  Prediabetes  The 10-year ASCVD risk score Denman George(Goff DC Jr., et al., 2013) is: 28.1%   Values used to calculate the score:     Age: [age] years     Sex: Male     Is Non-Hispanic African American: Yes     Diabetic: No     Tobacco smoker: Yes     Systolic Blood Pressure: 134 mmHg     Is BP treated: Yes     HDL Cholesterol: 42 mg/dL     Total Cholesterol: 188 mg/dL Based on this high risk calculation, will start statin, Crestor 20 mg daily.  Patient Instructions       If you have lab work done today you will be contacted with your lab results within the next 2 weeks.  If you have not heard from us then please contact us. The fastest way to get your results is to register for My Chart.   IF you received an x-ray today, you will receive an invoice from [facility]. Please contact [facility] at [phone] with questions or concerns regarding your invoice.   IF you received labwork today, you will receive an invoice from  [facility]. Please contact [facility] at [phone] with questions or concerns regarding your invoice.   Our billing staff will not be able to assist you with questions regarding bills from these companies.  You will be contacted with the lab results as soon as they are available. The fastest way to get your results is to activate your My Chart account. Instructions are located on the last page of this paperwork. If you have not heard from us regarding the results in 2 weeks, please contact this office.     DASH Eating Plan DASH stands for "Dietary Approaches to Stop Hypertension." The DASH eating plan is a healthy eating plan that has been shown to reduce high blood pressure (hypertension). It may also reduce your risk for type 2 diabetes, heart disease, and stroke. The DASH eating plan may also help with weight loss. What are tips for following this plan?  General guidelines  Avoid eating more than 2,300 mg (milligrams) of salt (sodium) a day. If you have hypertension, you may need to reduce your sodium intake to 1,500 mg a day.  Limit alcohol intake to no more than 1 drink a day for nonpregnant women and 2 drinks a day for men. One drink equals 12 oz of beer, 5 oz of wine, or 1 oz of hard liquor.  Work with your health care provider to maintain a healthy body weight or to lose weight. Ask what an ideal weight is for you.  Get at least 30 minutes of exercise that causes your heart to beat faster (aerobic exercise) most days of the week. Activities may include walking, swimming, or biking.  Work with your health care provider or diet and nutrition specialist (dietitian) to adjust your  eating plan to your individual calorie needs. Reading food labels   Check food labels for the amount of sodium per serving. Choose foods with less than 5 percent of the Daily Value of sodium. Generally, foods with less than 300 mg of sodium per serving fit into this eating plan.  To find whole grains, look  for the word "whole" as the first word in the ingredient list. Shopping  Buy products labeled as "low-sodium" or "no salt added."  Buy fresh foods. Avoid canned foods and premade or frozen meals. Cooking  Avoid adding salt when cooking. Use salt-free seasonings or herbs instead of table salt or sea salt. Check with your health care provider or pharmacist before using salt substitutes.  Do not fry foods. Cook foods using healthy methods such as baking, boiling, grilling, and broiling instead.  Cook with heart-healthy oils, such as olive, canola, soybean, or sunflower oil. Meal planning  Eat a balanced diet that includes: ? 5 or more servings of fruits and vegetables each day. At each meal, try to fill half of your plate with fruits and vegetables. ? Up to 6-8 servings of whole grains each day. ? Less than 6 oz of lean meat, poultry, or fish each day. A 3-oz serving of meat is about the same size as a deck of cards. One egg equals 1 oz. ? 2 servings of low-fat dairy each day. ? A serving of nuts, seeds, or beans 5 times each week. ? Heart-healthy fats. Healthy fats called Omega-3 fatty acids are found in foods such as flaxseeds and coldwater fish, like sardines, salmon, and mackerel.  Limit how much you eat of the following: ? Canned or prepackaged foods. ? Food that is high in trans fat, such as fried foods. ? Food that is high in saturated fat, such as fatty meat. ? Sweets, desserts, sugary drinks, and other foods with added sugar. ? Full-fat dairy products.  Do not salt foods before eating.  Try to eat at least 2 vegetarian meals each week.  Eat more home-cooked food and less restaurant, buffet, and fast food.  When eating at a restaurant, ask that your food be prepared with less salt or no salt, if possible. What foods are recommended? The items listed may not be a complete list. Talk with your dietitian about what dietary choices are best for you. Grains Whole-grain or  whole-wheat bread. Whole-grain or whole-wheat pasta. Brown rice. Orpah Cobb. Bulgur. Whole-grain and low-sodium cereals. Pita bread. Low-fat, low-sodium crackers. Whole-wheat flour tortillas. Vegetables Fresh or frozen vegetables (raw, steamed, roasted, or grilled). Low-sodium or reduced-sodium tomato and vegetable juice. Low-sodium or reduced-sodium tomato sauce and tomato paste. Low-sodium or reduced-sodium canned vegetables. Fruits All fresh, dried, or frozen fruit. Canned fruit in natural juice (without added sugar). Meat and other protein foods Skinless chicken or [place]. Ground chicken or [place]. Pork with fat trimmed off. Fish and seafood. Egg whites. Dried beans, peas, or lentils. Unsalted nuts, nut butters, and seeds. Unsalted canned beans. Lean cuts of beef with fat trimmed off. Low-sodium, lean deli meat. Dairy Low-fat (1%) or fat-free (skim) milk. Fat-free, low-fat, or reduced-fat cheeses. Nonfat, low-sodium ricotta or cottage cheese. Low-fat or nonfat yogurt. Low-fat, low-sodium cheese. Fats and oils Soft margarine without trans fats. Vegetable oil. Low-fat, reduced-fat, or light mayonnaise and salad dressings (reduced-sodium). Canola, safflower, olive, soybean, and sunflower oils. Avocado. Seasoning and other foods Herbs. Spices. Seasoning mixes without salt. Unsalted popcorn and pretzels. Fat-free sweets. What foods are not recommended? The items  listed may not be a complete list. Talk with your dietitian about what dietary choices are best for you. Grains Baked goods made with fat, such as croissants, muffins, or some breads. Dry pasta or rice meal packs. Vegetables Creamed or fried vegetables. Vegetables in a cheese sauce. Regular canned vegetables (not low-sodium or reduced-sodium). Regular canned tomato sauce and paste (not low-sodium or reduced-sodium). Regular tomato and vegetable juice (not low-sodium or reduced-sodium). Rosita Fire. Olives. Fruits Canned fruit in a light  or heavy syrup. Fried fruit. Fruit in cream or butter sauce. Meat and other protein foods Fatty cuts of meat. Ribs. Fried meat. Tomasa Blase. Sausage. Bologna and other processed lunch meats. Salami. Fatback. Hotdogs. Bratwurst. Salted nuts and seeds. Canned beans with added salt. Canned or smoked fish. Whole eggs or egg yolks. Chicken or [place] with skin. Dairy Whole or 2% milk, cream, and half-and-half. Whole or full-fat cream cheese. Whole-fat or sweetened yogurt. Full-fat cheese. Nondairy creamers. Whipped toppings. Processed cheese and cheese spreads. Fats and oils Butter. Stick margarine. Lard. Shortening. Ghee. Bacon fat. Tropical oils, such as coconut, palm kernel, or palm oil. Seasoning and other foods Salted popcorn and pretzels. Onion salt, garlic salt, seasoned salt, table salt, and sea salt. Worcestershire sauce. Tartar sauce. Barbecue sauce. Teriyaki sauce. Soy sauce, including reduced-sodium. Steak sauce. Canned and packaged gravies. Fish sauce. Oyster sauce. Cocktail sauce. Horseradish that you find on the shelf. Ketchup. Mustard. Meat flavorings and tenderizers. Bouillon cubes. Hot sauce and Tabasco sauce. Premade or packaged marinades. Premade or packaged taco seasonings. Relishes. Regular salad dressings. Where to find more information:  National Heart, Lung, and Blood Institute: PopSteam.is  American Heart Association: www.heart.org Summary  The DASH eating plan is a healthy eating plan that has been shown to reduce high blood pressure (hypertension). It may also reduce your risk for type 2 diabetes, heart disease, and stroke.  With the DASH eating plan, you should limit salt (sodium) intake to 2,300 mg a day. If you have hypertension, you may need to reduce your sodium intake to 1,500 mg a day.  When on the DASH eating plan, aim to eat more fresh fruits and vegetables, whole grains, lean proteins, low-fat dairy, and heart-healthy fats.  Work with your health care provider or  diet and nutrition specialist (dietitian) to adjust your eating plan to your individual calorie needs. This information is not intended to replace advice given to you by your health care provider. Make sure you discuss any questions you have with your health care provider. Document Revised: 11/13/2017 Document Reviewed: 11/24/2016 Elsevier Patient Education  2020 [employer].  Hypertension, Adult High blood pressure (hypertension) is when the force of blood pumping through the arteries is too strong. The arteries are the blood vessels that carry blood from the heart throughout the body. Hypertension forces the heart to work harder to pump blood and may cause arteries to become narrow or stiff. Untreated or uncontrolled hypertension can cause a heart attack, heart failure, a stroke, kidney disease, and other problems. A blood pressure reading consists of a higher number over a lower number. Ideally, your blood pressure should be below 120/80. The first ("top") number is called the systolic pressure. It is a measure of the pressure in your arteries as your heart beats. The second ("bottom") number is called the diastolic pressure. It is a measure of the pressure in your arteries as the heart relaxes. What are the causes? The exact cause of this condition is not known. There are some conditions that  result in or are related to high blood pressure. What increases the risk? Some risk factors for high blood pressure are under your control. The following factors may make you more likely to develop this condition:  Smoking.  Having type 2 diabetes mellitus, high cholesterol, or both.  Not getting enough exercise or physical activity.  Being overweight.  Having too much fat, sugar, calories, or salt (sodium) in your diet.  Drinking too much alcohol. Some risk factors for high blood pressure may be difficult or impossible to change. Some of these factors include:  Having chronic kidney  disease.  Having a family history of high blood pressure.  Age. Risk increases with age.  Race. You may be at higher risk if you are African American.  Gender. Men are at higher risk than women before age 13. After age 48, women are at higher risk than men.  Having obstructive sleep apnea.  Stress. What are the signs or symptoms? High blood pressure may not cause symptoms. Very high blood pressure (hypertensive crisis) may cause:  Headache.  Anxiety.  Shortness of breath.  Nosebleed.  Nausea and vomiting.  Vision changes.  Severe chest pain.  Seizures. How is this diagnosed? This condition is diagnosed by measuring your blood pressure while you are seated, with your arm resting on a flat surface, your legs uncrossed, and your feet flat on the floor. The cuff of the blood pressure monitor will be placed directly against the skin of your upper arm at the level of your heart. It should be measured at least twice using the same arm. Certain conditions can cause a difference in blood pressure between your right and left arms. Certain factors can cause blood pressure readings to be lower or higher than normal for a short period of time:  When your blood pressure is higher when you are in a health care provider's office than when you are at home, this is called white coat hypertension. Most people with this condition do not need medicines.  When your blood pressure is higher at home than when you are in a health care provider's office, this is called masked hypertension. Most people with this condition may need medicines to control blood pressure. If you have a high blood pressure reading during one visit or you have normal blood pressure with other risk factors, you may be asked to:  Return on a different day to have your blood pressure checked again.  Monitor your blood pressure at home for 1 week or longer. If you are diagnosed with hypertension, you may have other blood or  imaging tests to help your health care provider understand your overall risk for other conditions. How is this treated? This condition is treated by making healthy lifestyle changes, such as eating healthy foods, exercising more, and reducing your alcohol intake. Your health care provider may prescribe medicine if lifestyle changes are not enough to get your blood pressure under control, and if:  Your systolic blood pressure is above 130.  Your diastolic blood pressure is above 80. Your personal target blood pressure may vary depending on your medical conditions, your age, and other factors. Follow these instructions at home: Eating and drinking   Eat a diet that is high in fiber and potassium, and low in sodium, added sugar, and fat. An example eating plan is called the DASH (Dietary Approaches to Stop Hypertension) diet. To eat this way: ? Eat plenty of fresh fruits and vegetables. Try to fill one half of  your plate at each meal with fruits and vegetables. ? Eat whole grains, such as whole-wheat pasta, brown rice, or whole-grain bread. Fill about one fourth of your plate with whole grains. ? Eat or drink low-fat dairy products, such as skim milk or low-fat yogurt. ? Avoid fatty cuts of meat, processed or cured meats, and poultry with skin. Fill about one fourth of your plate with lean proteins, such as fish, chicken without skin, beans, eggs, or tofu. ? Avoid pre-made and processed foods. These tend to be higher in sodium, added sugar, and fat.  Reduce your daily sodium intake. Most people with hypertension should eat less than 1,500 mg of sodium a day.  Do not drink alcohol if: ? Your health care provider tells you not to drink. ? You are pregnant, may be pregnant, or are planning to become pregnant.  If you drink alcohol: ? Limit how much you use to:  0-1 drink a day for women.  0-2 drinks a day for men. ? Be aware of how much alcohol is in your drink. In the [place] one drink equals  one 12 oz bottle of beer (355 mL), one 5 oz glass of wine (148 mL), or one 1 oz glass of hard liquor (44 mL). Lifestyle   Work with your health care provider to maintain a healthy body weight or to lose weight. Ask what an ideal weight is for you.  Get at least 30 minutes of exercise most days of the week. Activities may include walking, swimming, or biking.  Include exercise to strengthen your muscles (resistance exercise), such as Pilates or lifting weights, as part of your weekly exercise routine. Try to do these types of exercises for 30 minutes at least 3 days a week.  Do not use any products that contain nicotine or tobacco, such as cigarettes, e-cigarettes, and chewing tobacco. If you need help quitting, ask your health care provider.  Monitor your blood pressure at home as told by your health care provider.  Keep all follow-up visits as told by your health care provider. This is important. Medicines  Take over-the-counter and prescription medicines only as told by your health care provider. Follow directions carefully. Blood pressure medicines must be taken as prescribed.  Do not skip doses of blood pressure medicine. Doing this puts you at risk for problems and can make the medicine less effective.  Ask your health care provider about side effects or reactions to medicines that you should watch for. Contact a health care provider if you:  Think you are having a reaction to a medicine you are taking.  Have headaches that keep coming back (recurring).  Feel dizzy.  Have swelling in your ankles.  Have trouble with your vision. Get help right away if you:  Develop a severe headache or confusion.  Have unusual weakness or numbness.  Feel faint.  Have severe pain in your chest or abdomen.  Vomit repeatedly.  Have trouble breathing. Summary  Hypertension is when the force of blood pumping through your arteries is too strong. If this condition is not controlled, it  may put you at risk for serious complications.  Your personal target blood pressure may vary depending on your medical conditions, your age, and other factors. For most people, a normal blood pressure is less than 120/80.  Hypertension is treated with lifestyle changes, medicines, or a combination of both. Lifestyle changes include losing weight, eating a healthy, low-sodium diet, exercising more, and limiting alcohol. This information is not  intended to replace advice given to you by your health care provider. Make sure you discuss any questions you have with your health care provider. Document Revised: 08/11/2018 Document Reviewed: 08/11/2018 Elsevier Patient Education  2020 Elsevier Inc.      Edwina BarthMiguel Caliegh Middlekauff, MD [facility]

## 2019-12-22 NOTE — Assessment & Plan Note (Signed)
Well-controlled hypertension.  Continue present medications.  No changes.  Follow-up in 6 months.  Medically clear for dental work.

## 2019-12-23 LAB — LIPID PANEL
Chol/HDL Ratio: 4.5 ratio (ref 0.0–5.0)
Cholesterol, Total: 188 mg/dL (ref 100–199)
HDL: 42 mg/dL (ref >39)
LDL Chol Calc (NIH): 128 mg/dL — ABNORMAL HIGH (ref 0–99)
Triglycerides: 96 mg/dL (ref 0–149)
VLDL Cholesterol Cal: 18 mg/dL (ref 5–40)

## 2019-12-25 ENCOUNTER — Other Ambulatory Visit: Payer: Self-pay | Admitting: Emergency Medicine

## 2019-12-25 ENCOUNTER — Encounter: Payer: Self-pay | Admitting: Emergency Medicine

## 2019-12-25 DIAGNOSIS — Z9189 Other specified personal risk factors, not elsewhere classified: Secondary | ICD-10-CM

## 2019-12-25 MED ORDER — ROSUVASTATIN CALCIUM 20 MG PO TABS: 20.0000 mg | ORAL_TABLET | Freq: Every day | ORAL | 3 refills | Status: DC

## 2020-01-03 ENCOUNTER — Telehealth: Payer: Self-pay | Admitting: Emergency Medicine

## 2020-01-03 NOTE — Telephone Encounter (Signed)
Copied from CRM [id number]. Topic: General - Other >> Jan 03, 2020 12:00 PM Angela Nevin wrote: Patient requesting call back from nurse to discuss lab results.

## 2020-01-03 NOTE — Telephone Encounter (Signed)
Please Advise

## 2020-01-04 NOTE — Telephone Encounter (Signed)
Left detailed voicemail per [facility] that provider left message regarding his abnormal lipid result and that he needed to be on a statin.  Advised pt to return office call with any questions or concerns if he did not get Dr. Latrelle Dodrill message.

## 2020-01-04 NOTE — Telephone Encounter (Signed)
I sent him a message on 12/25/2019.  Make sure he read it.  Abnormal lipid profile.  Needs to be on a statin medication.  Thanks.

## 2020-06-14 ENCOUNTER — Ambulatory Visit: Payer: 59 | Admitting: Emergency Medicine

## 2020-11-16 ENCOUNTER — Telehealth: Payer: Self-pay | Admitting: Emergency Medicine

## 2020-11-16 NOTE — Telephone Encounter (Signed)
Received call from Lerry Liner [phone]) from [facility].  Patient scheduled for total hip rellacement surgery 12/10/2020. Needs echo, ekg, stress test, office notes, and labs from visit in 2017 or 2018. Facility faxing release form.  Please fax documents to [phone]

## 2020-11-16 NOTE — Telephone Encounter (Signed)
These will be faxed after we receive form

## 2020-11-19 NOTE — Telephone Encounter (Signed)
Travis Trevino called stating that she is going to refax stat request

## 2020-11-28 ENCOUNTER — Encounter: Payer: Self-pay | Admitting: Emergency Medicine

## 2020-11-28 ENCOUNTER — Ambulatory Visit: Payer: 59 | Admitting: Emergency Medicine

## 2020-11-28 ENCOUNTER — Other Ambulatory Visit: Payer: Self-pay

## 2020-11-28 VITALS — BP 129/84 | HR 63 | Temp 98.2°F | Resp 16 | Ht 72.0 in | Wt 180.0 lb

## 2020-11-28 DIAGNOSIS — Z1211 Encounter for screening for malignant neoplasm of colon: Secondary | ICD-10-CM

## 2020-11-28 DIAGNOSIS — Z9189 Other specified personal risk factors, not elsewhere classified: Secondary | ICD-10-CM

## 2020-11-28 DIAGNOSIS — I10 Essential (primary) hypertension: Secondary | ICD-10-CM | POA: Diagnosis not present

## 2020-11-28 DIAGNOSIS — E785 Hyperlipidemia, unspecified: Secondary | ICD-10-CM

## 2020-11-28 DIAGNOSIS — R7303 Prediabetes: Secondary | ICD-10-CM | POA: Diagnosis not present

## 2020-11-28 MED ORDER — ROSUVASTATIN CALCIUM 20 MG PO TABS: 20.0000 mg | ORAL_TABLET | Freq: Every day | ORAL | 3 refills | Status: DC

## 2020-11-28 MED ORDER — AMLODIPINE BESYLATE 5 MG PO TABS: 5.0000 mg | ORAL_TABLET | Freq: Every day | ORAL | 3 refills | Status: DC

## 2020-11-28 MED ORDER — LISINOPRIL 20 MG PO TABS: 20.0000 mg | ORAL_TABLET | Freq: Every day | ORAL | 3 refills | Status: DC

## 2020-11-28 NOTE — Patient Instructions (Addendum)
   If you have lab work done today you will be contacted with your lab results within the next 2 weeks.  If you have not heard from us then please contact us. The fastest way to get your results is to register for My Chart.   IF you received an x-ray today, you will receive an invoice from [facility]. Please contact [facility] at [phone] with questions or concerns regarding your invoice.   IF you received labwork today, you will receive an invoice from [facility]. Please contact [facility] at [phone] with questions or concerns regarding your invoice.   Our billing staff will not be able to assist you with questions regarding bills from these companies.  You will be contacted with the lab results as soon as they are available. The fastest way to get your results is to activate your My Chart account. Instructions are located on the last page of this paperwork. If you have not heard from us regarding the results in 2 weeks, please contact this office.      Hypertension, Adult High blood pressure (hypertension) is when the force of blood pumping through the arteries is too strong. The arteries are the blood vessels that carry blood from the heart throughout the body. Hypertension forces the heart to work harder to pump blood and may cause arteries to become narrow or stiff. Untreated or uncontrolled hypertension can cause a heart attack, heart failure, a stroke, kidney disease, and other problems. A blood pressure reading consists of a higher number over a lower number. Ideally, your blood pressure should be below 120/80. The first ("top") number is called the systolic pressure. It is a measure of the pressure in your arteries as your heart beats. The second ("bottom") number is called the diastolic pressure. It is a measure of the pressure in your arteries as the heart relaxes. What are the causes? The exact cause of this condition is not known. There are some conditions  that result in or are related to high blood pressure. What increases the risk? Some risk factors for high blood pressure are under your control. The following factors may make you more likely to develop this condition:  Smoking.  Having type 2 diabetes mellitus, high cholesterol, or both.  Not getting enough exercise or physical activity.  Being overweight.  Having too much fat, sugar, calories, or salt (sodium) in your diet.  Drinking too much alcohol. Some risk factors for high blood pressure may be difficult or impossible to change. Some of these factors include:  Having chronic kidney disease.  Having a family history of high blood pressure.  Age. Risk increases with age.  Race. You may be at higher risk if you are African American.  Gender. Men are at higher risk than women before age 45. After age 65, women are at higher risk than men.  Having obstructive sleep apnea.  Stress. What are the signs or symptoms? High blood pressure may not cause symptoms. Very high blood pressure (hypertensive crisis) may cause:  Headache.  Anxiety.  Shortness of breath.  Nosebleed.  Nausea and vomiting.  Vision changes.  Severe chest pain.  Seizures. How is this diagnosed? This condition is diagnosed by measuring your blood pressure while you are seated, with your arm resting on a flat surface, your legs uncrossed, and your feet flat on the floor. The cuff of the blood pressure monitor will be placed directly against the skin of your upper arm at the level of your   heart. It should be measured at least twice using the same arm. Certain conditions can cause a difference in blood pressure between your right and left arms. Certain factors can cause blood pressure readings to be lower or higher than normal for a short period of time:  When your blood pressure is higher when you are in a health care provider's office than when you are at home, this is called white coat hypertension.  Most people with this condition do not need medicines.  When your blood pressure is higher at home than when you are in a health care provider's office, this is called masked hypertension. Most people with this condition may need medicines to control blood pressure. If you have a high blood pressure reading during one visit or you have normal blood pressure with other risk factors, you may be asked to:  Return on a different day to have your blood pressure checked again.  Monitor your blood pressure at home for 1 week or longer. If you are diagnosed with hypertension, you may have other blood or imaging tests to help your health care provider understand your overall risk for other conditions. How is this treated? This condition is treated by making healthy lifestyle changes, such as eating healthy foods, exercising more, and reducing your alcohol intake. Your health care provider may prescribe medicine if lifestyle changes are not enough to get your blood pressure under control, and if:  Your systolic blood pressure is above 130.  Your diastolic blood pressure is above 80. Your personal target blood pressure may vary depending on your medical conditions, your age, and other factors. Follow these instructions at home: Eating and drinking   Eat a diet that is high in fiber and potassium, and low in sodium, added sugar, and fat. An example eating plan is called the DASH (Dietary Approaches to Stop Hypertension) diet. To eat this way: ? Eat plenty of fresh fruits and vegetables. Try to fill one half of your plate at each meal with fruits and vegetables. ? Eat whole grains, such as whole-wheat pasta, brown rice, or whole-grain bread. Fill about one fourth of your plate with whole grains. ? Eat or drink low-fat dairy products, such as skim milk or low-fat yogurt. ? Avoid fatty cuts of meat, processed or cured meats, and poultry with skin. Fill about one fourth of your plate with lean proteins, such  as fish, chicken without skin, beans, eggs, or tofu. ? Avoid pre-made and processed foods. These tend to be higher in sodium, added sugar, and fat.  Reduce your daily sodium intake. Most people with hypertension should eat less than 1,500 mg of sodium a day.  Do not drink alcohol if: ? Your health care provider tells you not to drink. ? You are pregnant, may be pregnant, or are planning to become pregnant.  If you drink alcohol: ? Limit how much you use to:  0-1 drink a day for women.  0-2 drinks a day for men. ? Be aware of how much alcohol is in your drink. In the [place] one drink equals one 12 oz bottle of beer (355 mL), one 5 oz glass of wine (148 mL), or one 1 oz glass of hard liquor (44 mL). Lifestyle   Work with your health care provider to maintain a healthy body weight or to lose weight. Ask what an ideal weight is for you.  Get at least 30 minutes of exercise most days of the week. Activities may include walking, swimming,   or biking.  Include exercise to strengthen your muscles (resistance exercise), such as Pilates or lifting weights, as part of your weekly exercise routine. Try to do these types of exercises for 30 minutes at least 3 days a week.  Do not use any products that contain nicotine or tobacco, such as cigarettes, e-cigarettes, and chewing tobacco. If you need help quitting, ask your health care provider.  Monitor your blood pressure at home as told by your health care provider.  Keep all follow-up visits as told by your health care provider. This is important. Medicines  Take over-the-counter and prescription medicines only as told by your health care provider. Follow directions carefully. Blood pressure medicines must be taken as prescribed.  Do not skip doses of blood pressure medicine. Doing this puts you at risk for problems and can make the medicine less effective.  Ask your health care provider about side effects or reactions to medicines that you  should watch for. Contact a health care provider if you:  Think you are having a reaction to a medicine you are taking.  Have headaches that keep coming back (recurring).  Feel dizzy.  Have swelling in your ankles.  Have trouble with your vision. Get help right away if you:  Develop a severe headache or confusion.  Have unusual weakness or numbness.  Feel faint.  Have severe pain in your chest or abdomen.  Vomit repeatedly.  Have trouble breathing. Summary  Hypertension is when the force of blood pumping through your arteries is too strong. If this condition is not controlled, it may put you at risk for serious complications.  Your personal target blood pressure may vary depending on your medical conditions, your age, and other factors. For most people, a normal blood pressure is less than 120/80.  Hypertension is treated with lifestyle changes, medicines, or a combination of both. Lifestyle changes include losing weight, eating a healthy, low-sodium diet, exercising more, and limiting alcohol. This information is not intended to replace advice given to you by your health care provider. Make sure you discuss any questions you have with your health care provider. Document Revised: 08/11/2018 Document Reviewed: 08/11/2018 [employer] Patient Education  2020 [employer]  

## 2020-11-28 NOTE — Progress Notes (Signed)
Julieanne Mansonrnest R Kumpf 64 y.o.   Chief Complaint  Patient presents with  . Hypertension    Follow up   . Medication Refill    Amlodipine, Lisinopril and Rosuvastatin    HISTORY OF PRESENT ILLNESS: This is a 64 y.o. male with history of hypertension here for follow-up and medication refill. Scheduled to undergo bilateral hip replacement on December 27.  Had preop done somewhere else. No other complaints or medical concerns today. Fully vaccinated against Covid.  HPI   Prior to Admission medications   Medication Sig Start Date End Date Taking? Authorizing Provider  lisinopril (ZESTRIL) 20 MG tablet Take 1 tablet (20 mg total) by mouth daily. 12/22/19  Yes Dinh Ayotte, Eilleen KempfMiguel Jose, MD  rosuvastatin (CRESTOR) 20 MG tablet Take 1 tablet (20 mg total) by mouth daily. 12/25/19  Yes Omer Puccinelli, Eilleen KempfMiguel Jose, MD  amLODipine (NORVASC) 5 MG tablet Take 1 tablet (5 mg total) by mouth daily. 12/22/19 03/21/20  Georgina QuintSagardia, Zalen Sequeira Jose, MD  Robyne Askewastellani Paint 1.5 % LIQD Apply 1 application topically daily. Patient not taking: Reported on 11/28/2020 05/25/18   Hyatt, Max T, DPM  terbinafine (LAMISIL) 250 MG tablet Take 1 tablet (250 mg total) by mouth daily. Patient not taking: Reported on 11/28/2020 05/25/18   Elinor ParkinsonHyatt, Max T, DPM    Not on File  Patient Active Problem List   Diagnosis Date Noted  . Prediabetes 12/22/2019  . Dyslipidemia 12/22/2019  . Essential hypertension 06/30/2018  . RLS (restless legs syndrome) 11/15/2016    Past Medical History:  Diagnosis Date  . Arthritis   . Heart murmur     Past Surgical History:  Procedure Laterality Date  . KNEE ARTHROSCOPY W/ MENISCAL REPAIR Left    06/2016    Social History   Socioeconomic History  . Marital status: Married    Spouse name: Not on file  . Number of children: Not on file  . Years of education: Not on file  . Highest education level: Not on file  Occupational History  . Not on file  Tobacco Use  . Smoking status: Current Some Day  Smoker  . Smokeless tobacco: Never Used  Substance and Sexual Activity  . Alcohol use: Yes    Comment: social  . Drug use: Never  . Sexual activity: Yes  Other Topics Concern  . Not on file  Social History Narrative  . Not on file   Social Determinants of Health   Financial Resource Strain: Not on file  Food Insecurity: Not on file  Transportation Needs: Not on file  Physical Activity: Not on file  Stress: Not on file  Social Connections: Not on file  Intimate Partner Violence: Not on file    Family History  Problem Relation Age of Onset  . Cancer Maternal Aunt      Review of Systems  Constitutional: Negative.  Negative for chills and fever.  HENT: Negative.  Negative for congestion and sore throat.   Respiratory: Negative.  Negative for cough and shortness of breath.   Cardiovascular: Negative.  Negative for chest pain and palpitations.  Gastrointestinal: Negative.  Negative for abdominal pain, blood in stool, diarrhea, melena, nausea and vomiting.  Genitourinary: Negative.  Negative for dysuria and hematuria.  Musculoskeletal: Positive for joint pain (Both hips).  Skin: Negative.  Negative for rash.  Neurological: Negative.  Negative for dizziness and headaches.  All other systems reviewed and are negative.   Today's Vitals   11/28/20 1047  BP: 129/84  Pulse: 63  Resp: 16  Temp: 98.2 F (36.8 C)  TempSrc: Temporal  SpO2: 98%  Weight: 180 lb (81.6 kg)  Height: 6' (1.829 m)   Body mass index is 24.41 kg/m.  Physical Exam Vitals reviewed.  Constitutional:      Appearance: Normal appearance.  HENT:     Head: Normocephalic.  Eyes:     Extraocular Movements: Extraocular movements intact.     Pupils: Pupils are equal, round, and reactive to light.  Cardiovascular:     Rate and Rhythm: Normal rate and regular rhythm.     Pulses: Normal pulses.     Heart sounds: Normal heart sounds.  Pulmonary:     Effort: Pulmonary effort is normal.     Breath sounds:  Normal breath sounds.  Musculoskeletal:     Cervical back: Normal range of motion and neck supple.     Right lower leg: No edema.     Left lower leg: No edema.  Skin:    General: Skin is warm and dry.     Capillary Refill: Capillary refill takes less than 2 seconds.  Neurological:     General: No focal deficit present.     Mental Status: He is alert and oriented to person, place, and time.  Psychiatric:        Mood and Affect: Mood normal.        Behavior: Behavior normal.    A total of 30 minutes was spent with the patient, greater than 50% of which was in counseling/coordination of care regarding hypertension and dyslipidemia and cardiovascular risks associated with these conditions, review of all medications, education on nutrition, review of most recent office visit notes, review of most recent blood work results, health maintenance items, prognosis, documentation and need for follow-up.   ASSESSMENT & PLAN: Clinically stable.  No medical concerns identified during this visit. Continue present medications.  No changes. Follow-up in 6 months. Andrez was seen today for hypertension and medication refill.  Diagnoses and all orders for this visit:  Essential hypertension -     lisinopril (ZESTRIL) 20 MG tablet; Take 1 tablet (20 mg total) by mouth daily. -     amLODipine (NORVASC) 5 MG tablet; Take 1 tablet (5 mg total) by mouth daily.  Dyslipidemia  Prediabetes  At increased risk for cardiovascular disease -     rosuvastatin (CRESTOR) 20 MG tablet; Take 1 tablet (20 mg total) by mouth daily.  Screening for colon cancer -     Ambulatory referral to Gastroenterology    Patient Instructions       If you have lab work done today you will be contacted with your lab results within the next 2 weeks.  If you have not heard from [place] then please contact us. The fastest way to get your results is to register for My Chart.   IF you received an x-ray today, you will receive an  invoice from [facility]. Please contact [facility] at [phone] with questions or concerns regarding your invoice.   IF you received labwork today, you will receive an invoice from [place]. Please contact [facility] at [phone] with questions or concerns regarding your invoice.   Our billing staff will not be able to assist you with questions regarding bills from these companies.  You will be contacted with the lab results as soon as they are available. The fastest way to get your results is to activate your My Chart account. Instructions are located on the last page of this paperwork. If you have  not heard from [place] regarding the results in 2 weeks, please contact this office.     Hypertension, Adult High blood pressure (hypertension) is when the force of blood pumping through the arteries is too strong. The arteries are the blood vessels that carry blood from the heart throughout the body. Hypertension forces the heart to work harder to pump blood and may cause arteries to become narrow or stiff. Untreated or uncontrolled hypertension can cause a heart attack, heart failure, a stroke, kidney disease, and other problems. A blood pressure reading consists of a higher number over a lower number. Ideally, your blood pressure should be below 120/80. The first ("top") number is called the systolic pressure. It is a measure of the pressure in your arteries as your heart beats. The second ("bottom") number is called the diastolic pressure. It is a measure of the pressure in your arteries as the heart relaxes. What are the causes? The exact cause of this condition is not known. There are some conditions that result in or are related to high blood pressure. What increases the risk? Some risk factors for high blood pressure are under your control. The following factors may make you more likely to develop this condition:  Smoking.  Having type 2 diabetes mellitus, high cholesterol,  or both.  Not getting enough exercise or physical activity.  Being overweight.  Having too much fat, sugar, calories, or salt (sodium) in your diet.  Drinking too much alcohol. Some risk factors for high blood pressure may be difficult or impossible to change. Some of these factors include:  Having chronic kidney disease.  Having a family history of high blood pressure.  Age. Risk increases with age.  Race. You may be at higher risk if you are African American.  Gender. Men are at higher risk than women before age 107. After age 6, women are at higher risk than men.  Having obstructive sleep apnea.  Stress. What are the signs or symptoms? High blood pressure may not cause symptoms. Very high blood pressure (hypertensive crisis) may cause:  Headache.  Anxiety.  Shortness of breath.  Nosebleed.  Nausea and vomiting.  Vision changes.  Severe chest pain.  Seizures. How is this diagnosed? This condition is diagnosed by measuring your blood pressure while you are seated, with your arm resting on a flat surface, your legs uncrossed, and your feet flat on the floor. The cuff of the blood pressure monitor will be placed directly against the skin of your upper arm at the level of your heart. It should be measured at least twice using the same arm. Certain conditions can cause a difference in blood pressure between your right and left arms. Certain factors can cause blood pressure readings to be lower or higher than normal for a short period of time:  When your blood pressure is higher when you are in a health care provider's office than when you are at home, this is called white coat hypertension. Most people with this condition do not need medicines.  When your blood pressure is higher at home than when you are in a health care provider's office, this is called masked hypertension. Most people with this condition may need medicines to control blood pressure. If you have a high  blood pressure reading during one visit or you have normal blood pressure with other risk factors, you may be asked to:  Return on a different day to have your blood pressure checked again.  Monitor your blood pressure  at home for 1 week or longer. If you are diagnosed with hypertension, you may have other blood or imaging tests to help your health care provider understand your overall risk for other conditions. How is this treated? This condition is treated by making healthy lifestyle changes, such as eating healthy foods, exercising more, and reducing your alcohol intake. Your health care provider may prescribe medicine if lifestyle changes are not enough to get your blood pressure under control, and if:  Your systolic blood pressure is above 130.  Your diastolic blood pressure is above 80. Your personal target blood pressure may vary depending on your medical conditions, your age, and other factors. Follow these instructions at home: Eating and drinking   Eat a diet that is high in fiber and potassium, and low in sodium, added sugar, and fat. An example eating plan is called the DASH (Dietary Approaches to Stop Hypertension) diet. To eat this way: ? Eat plenty of fresh fruits and vegetables. Try to fill one half of your plate at each meal with fruits and vegetables. ? Eat whole grains, such as whole-wheat pasta, brown rice, or whole-grain bread. Fill about one fourth of your plate with whole grains. ? Eat or drink low-fat dairy products, such as skim milk or low-fat yogurt. ? Avoid fatty cuts of meat, processed or cured meats, and poultry with skin. Fill about one fourth of your plate with lean proteins, such as fish, chicken without skin, beans, eggs, or tofu. ? Avoid pre-made and processed foods. These tend to be higher in sodium, added sugar, and fat.  Reduce your daily sodium intake. Most people with hypertension should eat less than 1,500 mg of sodium a day.  Do not drink alcohol  if: ? Your health care provider tells you not to drink. ? You are pregnant, may be pregnant, or are planning to become pregnant.  If you drink alcohol: ? Limit how much you use to:  0-1 drink a day for women.  0-2 drinks a day for men. ? Be aware of how much alcohol is in your drink. In the [place] one drink equals one 12 oz bottle of beer (355 mL), one 5 oz glass of wine (148 mL), or one 1 oz glass of hard liquor (44 mL). Lifestyle   Work with your health care provider to maintain a healthy body weight or to lose weight. Ask what an ideal weight is for you.  Get at least 30 minutes of exercise most days of the week. Activities may include walking, swimming, or biking.  Include exercise to strengthen your muscles (resistance exercise), such as Pilates or lifting weights, as part of your weekly exercise routine. Try to do these types of exercises for 30 minutes at least 3 days a week.  Do not use any products that contain nicotine or tobacco, such as cigarettes, e-cigarettes, and chewing tobacco. If you need help quitting, ask your health care provider.  Monitor your blood pressure at home as told by your health care provider.  Keep all follow-up visits as told by your health care provider. This is important. Medicines  Take over-the-counter and prescription medicines only as told by your health care provider. Follow directions carefully. Blood pressure medicines must be taken as prescribed.  Do not skip doses of blood pressure medicine. Doing this puts you at risk for problems and can make the medicine less effective.  Ask your health care provider about side effects or reactions to medicines that you should watch for.  Contact a health care provider if you:  Think you are having a reaction to a medicine you are taking.  Have headaches that keep coming back (recurring).  Feel dizzy.  Have swelling in your ankles.  Have trouble with your vision. Get help right away if  you:  Develop a severe headache or confusion.  Have unusual weakness or numbness.  Feel faint.  Have severe pain in your chest or abdomen.  Vomit repeatedly.  Have trouble breathing. Summary  Hypertension is when the force of blood pumping through your arteries is too strong. If this condition is not controlled, it may put you at risk for serious complications.  Your personal target blood pressure may vary depending on your medical conditions, your age, and other factors. For most people, a normal blood pressure is less than 120/80.  Hypertension is treated with lifestyle changes, medicines, or a combination of both. Lifestyle changes include losing weight, eating a healthy, low-sodium diet, exercising more, and limiting alcohol. This information is not intended to replace advice given to you by your health care provider. Make sure you discuss any questions you have with your health care provider. Document Revised: 08/11/2018 Document Reviewed: 08/11/2018 Elsevier Patient Education  2020 Elsevier Inc.     Edwina Barth, MD [facility]

## 2021-06-03 ENCOUNTER — Ambulatory Visit: Payer: Self-pay | Admitting: Emergency Medicine

## 2021-06-03 ENCOUNTER — Encounter: Payer: Self-pay | Admitting: Emergency Medicine

## 2021-06-03 NOTE — Progress Notes (Deleted)
The 10-year ASCVD risk score Denman George DC Montez Hageman., et al., 2013) is: 27.3%   Values used to calculate the score:     Age: 65 years     Sex: Male     Is Non-Hispanic African American: Yes     Diabetic: No     Tobacco smoker: Yes     Systolic Blood Pressure: 129 mmHg     Is BP treated: Yes     HDL Cholesterol: 42 mg/dL     Total Cholesterol: 188 mg/dL BP Readings from Last 3 Encounters:  11/28/20 129/84  12/22/19 134/82  11/24/19 (!) 184/101   Lab Results  Component Value Date   HGBA1C 6.0 (A) 12/22/2019   Lab Results  Component Value Date   CHOL 188 12/22/2019   HDL 42 12/22/2019   LDLCALC 128 (H) 12/22/2019   TRIG 96 12/22/2019   CHOLHDL 4.5 12/22/2019

## 2021-07-29 ENCOUNTER — Encounter (HOSPITAL_COMMUNITY): Payer: Self-pay

## 2021-07-29 ENCOUNTER — Emergency Department (HOSPITAL_COMMUNITY)
Admission: EM | Admit: 2021-07-29 | Discharge: 2021-07-30 | Disposition: A | Discharge status: Home / Self Care | Payer: 59 | Attending: Emergency Medicine | Admitting: Emergency Medicine

## 2021-07-29 ENCOUNTER — Emergency Department (HOSPITAL_COMMUNITY): Payer: 59

## 2021-07-29 DIAGNOSIS — Z79899 Other long term (current) drug therapy: Secondary | ICD-10-CM | POA: Insufficient documentation

## 2021-07-29 DIAGNOSIS — M79642 Pain in left hand: Secondary | ICD-10-CM | POA: Insufficient documentation

## 2021-07-29 DIAGNOSIS — R531 Weakness: Secondary | ICD-10-CM | POA: Insufficient documentation

## 2021-07-29 DIAGNOSIS — M5412 Radiculopathy, cervical region: Secondary | ICD-10-CM | POA: Diagnosis not present

## 2021-07-29 DIAGNOSIS — R29898 Other symptoms and signs involving the musculoskeletal system: Secondary | ICD-10-CM

## 2021-07-29 DIAGNOSIS — I10 Essential (primary) hypertension: Secondary | ICD-10-CM | POA: Insufficient documentation

## 2021-07-29 DIAGNOSIS — Y900 Blood alcohol level of less than 20 mg/100 ml: Secondary | ICD-10-CM | POA: Diagnosis not present

## 2021-07-29 DIAGNOSIS — F17200 Nicotine dependence, unspecified, uncomplicated: Secondary | ICD-10-CM | POA: Insufficient documentation

## 2021-07-29 LAB — CBC
HCT: 40.1 % (ref 39.0–52.0)
Hemoglobin: 13.9 g/dL (ref 13.0–17.0)
MCH: 31.7 pg (ref 26.0–34.0)
MCHC: 34.7 g/dL (ref 30.0–36.0)
MCV: 91.6 fL (ref 80.0–100.0)
Platelets: 375 10*3/uL (ref 150–400)
RBC: 4.38 MIL/uL (ref 4.22–5.81)
RDW: 13.4 % (ref 11.5–15.5)
WBC: 8.7 10*3/uL (ref 4.0–10.5)
nRBC: 0 % (ref 0.0–0.2)

## 2021-07-29 LAB — URINALYSIS, ROUTINE W REFLEX MICROSCOPIC
Bacteria, UA: NONE SEEN
Bilirubin Urine: NEGATIVE
Glucose, UA: NEGATIVE mg/dL
Ketones, ur: NEGATIVE mg/dL
Leukocytes,Ua: NEGATIVE
Nitrite: NEGATIVE
Protein, ur: NEGATIVE mg/dL
Specific Gravity, Urine: 1.01 (ref 1.005–1.030)
pH: 6 (ref 5.0–8.0)

## 2021-07-29 LAB — RAPID URINE DRUG SCREEN, HOSP PERFORMED
Amphetamines: NOT DETECTED
Barbiturates: NOT DETECTED
Benzodiazepines: NOT DETECTED
Cocaine: POSITIVE — AB
Opiates: NOT DETECTED
Tetrahydrocannabinol: NOT DETECTED

## 2021-07-29 LAB — I-STAT CHEM 8, ED
BUN: 12 mg/dL (ref 8–23)
Calcium, Ion: 1.14 mmol/L — ABNORMAL LOW (ref 1.15–1.40)
Chloride: 105 mmol/L (ref 98–111)
Creatinine, Ser: 1.2 mg/dL (ref 0.61–1.24)
Glucose, Bld: 80 mg/dL (ref 70–99)
HCT: 41 % (ref 39.0–52.0)
Hemoglobin: 13.9 g/dL (ref 13.0–17.0)
Potassium: 3.3 mmol/L — ABNORMAL LOW (ref 3.5–5.1)
Sodium: 140 mmol/L (ref 135–145)
TCO2: 25 mmol/L (ref 22–32)

## 2021-07-29 LAB — COMPREHENSIVE METABOLIC PANEL
ALT: 30 U/L (ref 0–44)
AST: 26 U/L (ref 15–41)
Albumin: 3.8 g/dL (ref 3.5–5.0)
Alkaline Phosphatase: 59 U/L (ref 38–126)
Anion gap: 9 (ref 5–15)
BUN: 12 mg/dL (ref 8–23)
CO2: 23 mmol/L (ref 22–32)
Calcium: 9.1 mg/dL (ref 8.9–10.3)
Chloride: 104 mmol/L (ref 98–111)
Creatinine, Ser: 1.2 mg/dL (ref 0.61–1.24)
GFR, Estimated: >60 mL/min (ref >60)
Glucose, Bld: 82 mg/dL (ref 70–99)
Potassium: 3.3 mmol/L — ABNORMAL LOW (ref 3.5–5.1)
Sodium: 136 mmol/L (ref 135–145)
Total Bilirubin: 0.4 mg/dL (ref 0.3–1.2)
Total Protein: 7.1 g/dL (ref 6.5–8.1)

## 2021-07-29 LAB — DIFFERENTIAL
Abs Immature Granulocytes: 0.02 10*3/uL (ref 0.00–0.07)
Basophils Absolute: 0.1 10*3/uL (ref 0.0–0.1)
Basophils Relative: 1 %
Eosinophils Absolute: 0.1 10*3/uL (ref 0.0–0.5)
Eosinophils Relative: 1 %
Immature Granulocytes: 0 %
Lymphocytes Relative: 27 %
Lymphs Abs: 2.4 10*3/uL (ref 0.7–4.0)
Monocytes Absolute: 0.7 10*3/uL (ref 0.1–1.0)
Monocytes Relative: 8 %
Neutro Abs: 5.4 10*3/uL (ref 1.7–7.7)
Neutrophils Relative %: 63 %

## 2021-07-29 LAB — PROTIME-INR
INR: 1 (ref 0.8–1.2)
Prothrombin Time: 13 seconds (ref 11.4–15.2)

## 2021-07-29 LAB — ETHANOL: Alcohol, Ethyl (B): <10 mg/dL (ref <10)

## 2021-07-29 LAB — APTT: aPTT: 29 seconds (ref 24–36)

## 2021-07-29 NOTE — ED Triage Notes (Signed)
Pt states LSN 8am reports weakness in his L hand, was unable to play his guitar and unable to open a bottle. Pt has pain to L shoulder

## 2021-07-29 NOTE — ED Provider Notes (Signed)
Emergency Medicine Provider Triage Evaluation Note  Travis Trevino , a 65 y.o. male  was evaluated in triage.  Pt complains of left arm weakness that started at 8am today. Also c/o left shoulder pain. No left leg weakness  Review of Systems  Positive: Left arm weakness Negative: Left leg weakness, chest pain, headache  Physical Exam  BP (!) 232/113 (BP Location: Right Arm)   Pulse 91   Temp 98.9 F (37.2 C) (Oral)   Resp 19   SpO2 100%  Gen:   Awake, no distress  Resp:  Normal effort  MSK:   Moves extremities without difficulty  Other:  Weakness to the LUE, decreased sensation to the LUE, no facial droop, no aphasia, lle with normal strength and sensation, perrl  Medical Decision Making  Medically screening exam initiated at 9:34 PM.  Appropriate orders placed.  Yeshaya Vath Keckler was informed that the remainder of the evaluation will be completed by another provider, this initial triage assessment does not replace that evaluation, and the importance of remaining in the ED until their evaluation is complete.     Karrie Meres, PA-C 07/29/21 2136    Melene Plan, DO 07/29/21 2254

## 2021-07-30 ENCOUNTER — Emergency Department (HOSPITAL_COMMUNITY): Payer: 59

## 2021-07-30 LAB — TROPONIN I (HIGH SENSITIVITY)
Troponin I (High Sensitivity): 13 ng/L (ref <18)
Troponin I (High Sensitivity): 13 ng/L (ref <18)

## 2021-07-30 MED ORDER — IOHEXOL 350 MG/ML SOLN
80.0000 mL | Freq: Once | INTRAVENOUS | Status: AC | PRN
  Administered 2021-07-30: 80 mL via INTRAVENOUS

## 2021-07-30 MED ORDER — METHYLPREDNISOLONE 4 MG PO TBPK: ORAL_TABLET | ORAL | 0 refills | Status: DC

## 2021-07-30 MED ORDER — DEXAMETHASONE SODIUM PHOSPHATE 10 MG/ML IJ SOLN: 10.0000 mg | Freq: Once | INTRAMUSCULAR | Status: DC

## 2021-07-30 NOTE — Discharge Instructions (Addendum)
There is no evidence of stroke or aneurysm.  Take the steroids as prescribed.  Follow-up with the neurosurgeon as scheduled in 2 days on August 18.  Return to the ED with worsening weakness, numbness, tingling, chest pain, shortness of breath, any other concerns.

## 2021-07-30 NOTE — ED Provider Notes (Signed)
[facility] Provider Note   CSN: [id number] Arrival date & time: 07/29/21  2058     History Chief Complaint  Patient presents with   Extremity Weakness    Travis Trevino is a 65 y.o. male.  Patient with a history of hypertension, high cholesterol, heart murmur presenting with left hand weakness for the past several days.  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  States the last time his hand was normal was 2 or 3 days ago.  Complains of weakness in his left grip strength.  When he turns his head to the left he gets occasional numbness and tingling shooting down the left arm to the fingers.  He has also noticed a [facility] that is tender.  Denies any fall or trauma.  Not[facility] a [facility] today.  Denies any headache.  Denies any visual change.  Denies any chest pain or shortness of breath.  No abdominal pain, nausea or vomiting.  No difficulty speaking or difficulty swallowing.  No weakness in his leg.  No facial weakness.  Denies any alcohol or drug use.  The history is provided by the patient and a relative.  Extremity Weakness Pertinent negatives include no chest pain, no abdominal pain, no headaches and no shortness of breath.      Past Medical History:  Diagnosis Date   Arthritis    Heart murmur     Patient Active Problem List   Diagnosis Date Noted   Prediabetes 12/22/2019   Dyslipidemia 12/22/2019   Essential hypertension 06/30/2018   RLS (restless legs syndrome) 11/15/2016    Past Surgical History:  Procedure Laterality Date   KNEE ARTHROSCOPY W/ MENISCAL REPAIR Left    06/2016       Family History  Problem Relation Age of Onset   Cancer Maternal Aunt     Social History   Tobacco Use   Smoking status: Some Days   Smokeless tobacco: Never  Substance Use Topics    Alcohol use: Yes    Comment: social   Drug use: Never    Home Medications Prior to Admission medications   Medication Sig Start Date End Date Taking? Authorizing Provider  amLODipine (NORVASC) 5 MG tablet Take 1 tablet (5 mg total) by mouth daily. 11/28/20 02/26/21  Georgina QuintSagardia, Miguel Jose, MD  Robyne Askewastellani Paint 1.5 % LIQD Apply 1 application topically daily. Patient not taking: Reported on 11/28/2020 05/25/18   Hyatt, Max T, DPM  lisinopril (ZESTRIL) 20 MG tablet Take 1 tablet (20 mg total) by mouth daily. 11/28/20   Georgina QuintSagardia, Miguel Jose, MD  rosuvastatin (CRESTOR) 20 MG tablet Take 1 tablet (20 mg total) by mouth daily. 11/28/20   Georgina QuintSagardia, Miguel Jose, MD  terbinafine (LAMISIL) 250 MG tablet Take 1 tablet (250 mg total) by mouth daily. Patient not taking: Reported on 11/28/2020 05/25/18   Elinor ParkinsonHyatt, Max T, DPM    Allergies    Patient has no known allergies.  Review of Systems   Review of Systems  Constitutional:  Negative for activity change, appetite change and fever.  HENT:  Negative for congestion.   Respiratory:  Negative for chest tightness and shortness of breath.   Cardiovascular:  Negative for chest pain.  Gastrointestinal:  Negative for abdominal pain, nausea and vomiting.  Genitourinary:  Negative for dysuria and hematuria.  Musculoskeletal:  Positive for arthralgias, extremity weakness and myalgias. Negative for back pain.  Skin:  Negative for wound.  Neurological:  Positive for weakness and numbness. Negative for dizziness and headaches.   all other systems are negative except as noted in the HPI and PMH.   Physical Exam Updated Vital Signs BP (!) 172/111 (BP Location: Right Arm)   Pulse 80   Temp 98.9 F (37.2 C) (Oral)   Resp 16   SpO2 100%   Physical Exam Vitals and nursing note reviewed.  Constitutional:      General: He is not in acute distress.    Appearance: He is well-developed.  HENT:     Head: Normocephalic and atraumatic.     Mouth/Throat:      Pharynx: No oropharyngeal exudate.  Eyes:     Conjunctiva/sclera: Conjunctivae normal.     Pupils: Pupils are equal, round, and reactive to light.  Neck:     Comments: No meningismus. Cardiovascular:     Rate and Rhythm: Normal rate and regular rhythm.     Heart sounds: Normal heart sounds. No murmur heard. Pulmonary:     Effort: Pulmonary effort is normal. No respiratory distress.     Breath sounds: Normal breath sounds.  Abdominal:     Palpations: Abdomen is soft.     Tenderness: There is no abdominal tenderness. There is no guarding or rebound.  Musculoskeletal:        General: Tenderness present. Normal range of motion.     Cervical back: Normal range of motion and neck supple.     Comments: Tender prominent left AC joint on the left  Full range of motion of left shoulder without pain.  Skin:    General: Skin is warm.  Neurological:     Mental Status: He is alert and oriented to person, place, and time.     Cranial Nerves: No cranial nerve deficit.     Motor: No abnormal muscle tone.     Coordination: Coordination normal.     Comments: Decreased grip strength on left.  Forearm flexion and extension are intact bilaterally.  Able to hold arms against gravity bilaterally.  No pronator drift.  No facial droop.  No leg weakness.  Psychiatric:        Behavior: Behavior normal.    ED Results / Procedures / Treatments   Labs (all labs ordered are listed, but only abnormal results are displayed) Labs Reviewed  COMPREHENSIVE METABOLIC PANEL - Abnormal; Notable for the following components:      Result Value   Potassium 3.3 (*)    All other components within normal limits  RAPID URINE DRUG SCREEN, HOSP PERFORMED - Abnormal; Notable for the following components:   Cocaine POSITIVE (*)    All other components within normal limits  URINALYSIS, ROUTINE W REFLEX MICROSCOPIC - Abnormal; Notable for the following components:   Color, Urine STRAW (*)    Hgb urine dipstick SMALL (*)     All other components within normal limits  I-STAT CHEM 8, ED - Abnormal; Notable for the following components:   Potassium 3.3 (*)    Calcium, Ion 1.14 (*)    All other components within normal limits  ETHANOL  PROTIME-INR  APTT  CBC  DIFFERENTIAL  TROPONIN I (HIGH SENSITIVITY)  TROPONIN I (HIGH SENSITIVITY)    EKG EKG Interpretation  Date/Time:  Tuesday July 30 2021 00:28:53 EDT Ventricular Rate:  79 PR Interval:  154 QRS Duration: 100 QT Interval:  413 QTC  Calculation: 474 R Axis:   63 Text Interpretation: Sinus rhythm Probable left ventricular hypertrophy No previous ECGs available Confirmed by Glynn Octave [phone]) on 07/30/2021 12:32:19 AM  Radiology CT HEAD WO CONTRAST  Result Date: 07/29/2021 CLINICAL DATA:  Neuro deficit, acute, stroke suspected.CT Head Patient states LSN 8am reports weakness in his L hand, was unable to play his guitar and unable to open a bottle. Patient has pain to L shoulder. EXAM: CT HEAD WITHOUT CONTRAST TECHNIQUE: Contiguous axial images were obtained from the base of the skull through the vertex without intravenous contrast. COMPARISON:  None. FINDINGS: Brain: No evidence of large-territorial acute infarction. No parenchymal hemorrhage. No mass lesion. No extra-axial collection. No mass effect or midline shift. No hydrocephalus. Basilar cisterns are patent. Vascular: No hyperdense vessel. Skull: No acute fracture or focal lesion. Sinuses/Orbits: Paranasal sinuses and mastoid air cells are clear. The orbits are unremarkable. Other: None. IMPRESSION: No acute intracranial abnormality. Electronically Signed   By: Tish Frederickson M.D.   On: 07/29/2021 22:29   MR BRAIN WO CONTRAST  Result Date: 07/30/2021 CLINICAL DATA:  Left arm weakness EXAM: MRI HEAD WITHOUT CONTRAST MRI CERVICAL SPINE WITHOUT CONTRAST TECHNIQUE: Multiplanar, multiecho pulse sequences of the brain and surrounding structures, and cervical spine, to include the craniocervical junction  and cervicothoracic junction, were obtained without intravenous contrast. COMPARISON:  None. FINDINGS: MRI HEAD FINDINGS Brain: No acute infarct, mass effect or extra-axial collection. No acute or chronic hemorrhage. There is multifocal hyperintense T2-weighted signal within the white matter. Generalized volume loss without a clear lobar predilection. The midline structures are normal. Vascular: Major flow voids are preserved. Skull and upper cervical spine: Normal calvarium and skull base. Visualized upper cervical spine and soft tissues are normal. Sinuses/Orbits:No paranasal sinus fluid levels or advanced mucosal thickening. No mastoid or middle ear effusion. Normal orbits. MRI CERVICAL SPINE FINDINGS Alignment: Physiologic.  Reversal of normal cervical lordosis. Vertebrae: No fracture, evidence of discitis, or bone lesion. Cord: Normal signal and morphology. Posterior Fossa, vertebral arteries, paraspinal tissues: Negative. Disc levels: C1-2: Unremarkable. C2-3: Normal disc space and facet joints. There is no spinal canal stenosis. No neural foraminal stenosis. C3-4: Small disc bulge bulky uncovertebral hypertrophy. There is no spinal canal stenosis. Severe bilateral neural foraminal stenosis. C4-5: Small disc bulge with bilateral uncovertebral hypertrophy. Mild spinal canal stenosis. Severe bilateral neural foraminal stenosis. C5-6: Small disc bulge with bilateral uncovertebral hypertrophy. There is no spinal canal stenosis. Severe right and moderate left neural foraminal stenosis. C6-7: Large left subarticular disc protrusion with endplate spurring. There is no spinal canal stenosis. Moderate right and severe left neural foraminal stenosis. C7-T1: Left asymmetric disc bulge with endplate spurring. There is no spinal canal stenosis. Severe left neural foraminal stenosis. IMPRESSION: 1. No acute intracranial abnormality. 2. Chronic small vessel ischemia. 3. Mild spinal canal stenosis at C4-5. 4.  Moderate-to-severe neural foraminal stenosis throughout most of the cervical spine. Electronically Signed   By: Deatra Robinson M.D.   On: 07/30/2021 02:59   MR Cervical Spine Wo Contrast  Result Date: 07/30/2021 CLINICAL DATA:  Left arm weakness EXAM: MRI HEAD WITHOUT CONTRAST MRI CERVICAL SPINE WITHOUT CONTRAST TECHNIQUE: Multiplanar, multiecho pulse sequences of the brain and surrounding structures, and cervical spine, to include the craniocervical junction and cervicothoracic junction, were obtained without intravenous contrast. COMPARISON:  None. FINDINGS: MRI HEAD FINDINGS Brain: No acute infarct, mass effect or extra-axial collection. No acute or chronic hemorrhage. There is multifocal hyperintense T2-weighted signal within the white matter. Generalized volume loss  without a clear lobar predilection. The midline structures are normal. Vascular: Major flow voids are preserved. Skull and upper cervical spine: Normal calvarium and skull base. Visualized upper cervical spine and soft tissues are normal. Sinuses/Orbits:No paranasal sinus fluid levels or advanced mucosal thickening. No mastoid or middle ear effusion. Normal orbits. MRI CERVICAL SPINE FINDINGS Alignment: Physiologic.  Reversal of normal cervical lordosis. Vertebrae: No fracture, evidence of discitis, or bone lesion. Cord: Normal signal and morphology. Posterior Fossa, vertebral arteries, paraspinal tissues: Negative. Disc levels: C1-2: Unremarkable. C2-3: Normal disc space and facet joints. There is no spinal canal stenosis. No neural foraminal stenosis. C3-4: Small disc bulge bulky uncovertebral hypertrophy. There is no spinal canal stenosis. Severe bilateral neural foraminal stenosis. C4-5: Small disc bulge with bilateral uncovertebral hypertrophy. Mild spinal canal stenosis. Severe bilateral neural foraminal stenosis. C5-6: Small disc bulge with bilateral uncovertebral hypertrophy. There is no spinal canal stenosis. Severe right and moderate  left neural foraminal stenosis. C6-7: Large left subarticular disc protrusion with endplate spurring. There is no spinal canal stenosis. Moderate right and severe left neural foraminal stenosis. C7-T1: Left asymmetric disc bulge with endplate spurring. There is no spinal canal stenosis. Severe left neural foraminal stenosis. IMPRESSION: 1. No acute intracranial abnormality. 2. Chronic small vessel ischemia. 3. Mild spinal canal stenosis at C4-5. 4. Moderate-to-severe neural foraminal stenosis throughout most of the cervical spine. Electronically Signed   By: Deatra Robinson M.D.   On: 07/30/2021 02:59   DG Shoulder Left  Result Date: 07/30/2021 CLINICAL DATA:  Left shoulder pain, no known injury, initial encounter EXAM: LEFT SHOULDER - 2+ VIEW COMPARISON:  None FINDINGS: Mild degenerative changes of the acromioclavicular joint are noted. No acute fracture or dislocation is seen. Underlying bony thorax appears within normal limits. No soft tissue changes are seen. IMPRESSION: No acute abnormality noted. Electronically Signed   By: Alcide Clever M.D.   On: 07/30/2021 00:52    Procedures Procedures   Medications Ordered in ED Medications - No data to display  ED Course  I have reviewed the triage vital signs and the nursing notes.  Pertinent labs & imaging results that were available during my care of the patient were reviewed by me and considered in my medical decision making (see chart for details).    MDM Rules/Calculators/A&P                          Left upper extremity weakness for several days.  Code stroke not activated due to delay in presentation. No facial and leg weakness.  Does have prominent AC joint it is tender with intermittent numbness going down his arm.  No chest pain or shortness of breath.   MRI negative for acute infarct. Multilevel cervical spinal canal stenosis and foraminal stenosis as above.   MRI results d/w NP Meyran who reviewed images. She agrees with steroids and  outpatient followup in the office on 8/18.  CTA negative for aneurysm or large vessel occlusion.  Drug screen is positive for cocaine.  Discussed with patient who was made aware of these findings and that he should cease use.  Does have a prominent AC joint on the left side that is tender to palpation.  Steroid should help with this as well.  Will give sling for comfort and orthopedic follow-up. EKG unchanged. Troponin negative x2. Low suspicion for ACS or PE.   Patient to be seen in neurosurgery clinic on August 18.  Return to the ED with  worsening pain, weakness, numbness, tingling, chest pain, shortness of breath, any other concerns. Final Clinical Impression(s) / ED Diagnoses Final diagnoses:  LUE weakness  Cervical radiculopathy    Rx / DC Orders ED Discharge Orders     None        Deborahann Poteat, Jeannett Senior, MD 07/30/21 [phone]

## 2021-07-30 NOTE — ED Notes (Signed)
Patient transported to MRI 

## 2022-01-09 ENCOUNTER — Ambulatory Visit
Admission: RE | Admit: 2022-01-09 | Discharge: 2022-01-09 | Disposition: A | Discharge status: Home / Self Care | Payer: Worker's Compensation | Source: Ambulatory Visit | Attending: Nurse Practitioner | Admitting: Nurse Practitioner

## 2022-01-09 ENCOUNTER — Other Ambulatory Visit: Payer: Self-pay

## 2022-01-09 ENCOUNTER — Other Ambulatory Visit: Payer: Self-pay | Admitting: Nurse Practitioner

## 2022-01-09 DIAGNOSIS — T1490XA Injury, unspecified, initial encounter: Secondary | ICD-10-CM

## 2023-05-18 ENCOUNTER — Encounter: Payer: Self-pay | Admitting: Emergency Medicine

## 2023-05-18 ENCOUNTER — Ambulatory Visit: Payer: 59 | Admitting: Emergency Medicine

## 2023-05-18 ENCOUNTER — Ambulatory Visit (INDEPENDENT_AMBULATORY_CARE_PROVIDER_SITE_OTHER): Payer: 59

## 2023-05-18 VITALS — BP 162/102 | HR 70 | Temp 98.4°F | Ht 72.0 in | Wt 190.0 lb

## 2023-05-18 DIAGNOSIS — M25552 Pain in left hip: Secondary | ICD-10-CM | POA: Insufficient documentation

## 2023-05-18 DIAGNOSIS — Z96643 Presence of artificial hip joint, bilateral: Secondary | ICD-10-CM | POA: Insufficient documentation

## 2023-05-18 DIAGNOSIS — I10 Essential (primary) hypertension: Secondary | ICD-10-CM | POA: Diagnosis not present

## 2023-05-18 MED ORDER — AMLODIPINE BESYLATE 5 MG PO TABS: 5.0000 mg | ORAL_TABLET | Freq: Every day | ORAL | 3 refills | Status: DC

## 2023-05-18 MED ORDER — TRAMADOL HCL 50 MG PO TABS: 50.0000 mg | ORAL_TABLET | Freq: Three times a day (TID) | ORAL | 0 refills | Status: AC | PRN

## 2023-05-18 MED ORDER — LISINOPRIL 20 MG PO TABS: 20.0000 mg | ORAL_TABLET | Freq: Every day | ORAL | 3 refills | Status: DC

## 2023-05-18 NOTE — Patient Instructions (Signed)
Hip Pain The hip is the joint between the upper legs and the lower pelvis. The bones, cartilage, tendons, and muscles of your hip joint support your body and allow you to move around. Hip pain can range from a minor ache to severe pain in one or both of your hips. The pain may be felt on the inside of the hip joint near the groin, or on the outside near the buttocks and upper thigh. You may also have swelling or stiffness in your hip area. Follow these instructions at home: Managing pain, stiffness, and swelling     If told, put ice on the painful area. Put ice in a plastic bag. Place a towel between your skin and the bag. Leave the ice on for 20 minutes, 2-3 times a day. If told, apply heat to the affected area as often as told by your health care provider. Use the heat source that your provider recommends, such as a moist heat pack or a heating pad. Place a towel between your skin and the heat source. Leave the heat on for 20-30 minutes. If your skin turns bright red, remove the ice or heat right away to prevent skin damage. The risk of damage is higher if you cannot feel pain, heat, or cold. Activity Do exercises as told by your provider. Avoid activities that cause pain. General instructions  Take over-the-counter and prescription medicines only as told by your provider. Keep a journal of your symptoms. Write down: How often you have hip pain. The location of your pain. What the pain feels like. What makes the pain worse. Sleep with a pillow between your legs on your most comfortable side. Keep all follow-up visits. Your provider will monitor your pain and activity. Contact a health care provider if: You cannot put weight on your leg. Your pain or swelling gets worse after a week. It gets harder to walk. You have a fever. Get help right away if: You fall. You have a sudden increase in pain and swelling in your hip. Your hip is red or swollen or very tender to touch. This  information is not intended to replace advice given to you by your health care provider. Make sure you discuss any questions you have with your health care provider. Document Revised: 08/05/2022 Document Reviewed: 08/05/2022 [employer] Patient Education  2024 [employer]  

## 2023-05-18 NOTE — Progress Notes (Signed)
Travis Trevino 67 y.o.   Chief Complaint  Patient presents with   thigh pain    Left side thigh pain, been going for two days, shooting pain when walking, hard to stand on it for long period of time, taking OTC Ibuprofen/ Asprin ( with the past 24 hours)    HISTORY OF PRESENT ILLNESS: Acute problem visit today. This is a 67 y.o. male complaining of persistent pain to left hip for the past couple of weeks.  Has been road traveling for the past 4 weeks History of bilateral hip implants Also history of hypertension off medications at present time. No other complaints or medical concerns today  HPI   Prior to Admission medications   Medication Sig Start Date End Date Taking? Authorizing Provider  amLODipine (NORVASC) 5 MG tablet Take 1 tablet (5 mg total) by mouth daily. 11/28/20 02/26/21  Georgina Quint, MD  Robyne Askew Paint 1.5 % LIQD Apply 1 application topically daily. Patient not taking: Reported on 05/18/2023 05/25/18   Hyatt, Max T, DPM  lisinopril (ZESTRIL) 20 MG tablet Take 1 tablet (20 mg total) by mouth daily. Patient not taking: Reported on 05/18/2023 11/28/20   Georgina Quint, MD  methylPREDNISolone (MEDROL DOSEPAK) 4 MG TBPK tablet As directed Patient not taking: Reported on 05/18/2023 07/30/21   Glynn Octave, MD  rosuvastatin (CRESTOR) 20 MG tablet Take 1 tablet (20 mg total) by mouth daily. Patient not taking: Reported on 05/18/2023 11/28/20   Georgina Quint, MD    No Known Allergies  Patient Active Problem List   Diagnosis Date Noted   Prediabetes 12/22/2019   Dyslipidemia 12/22/2019   Essential hypertension 06/30/2018   RLS (restless legs syndrome) 11/15/2016    Past Medical History:  Diagnosis Date   Arthritis    Heart murmur     Past Surgical History:  Procedure Laterality Date   KNEE ARTHROSCOPY W/ MENISCAL REPAIR Left    06/2016    Social History   Socioeconomic History   Marital status: Married    Spouse name: Not on file    Number of children: Not on file   Years of education: Not on file   Highest education level: Not on file  Occupational History   Not on file  Tobacco Use   Smoking status: Some Days   Smokeless tobacco: Never  Substance and Sexual Activity   Alcohol use: Yes    Comment: social   Drug use: Never   Sexual activity: Yes  Other Topics Concern   Not on file  Social History Narrative   Not on file   Social Determinants of Health   Financial Resource Strain: Not on file  Food Insecurity: Not on file  Transportation Needs: Not on file  Physical Activity: Not on file  Stress: Not on file  Social Connections: Not on file  Intimate Partner Violence: Not on file    Family History  Problem Relation Age of Onset   Cancer Maternal Aunt      Review of Systems  Constitutional: Negative.  Negative for chills and fever.  HENT: Negative.  Negative for congestion and sore throat.   Respiratory: Negative.  Negative for cough and shortness of breath.   Cardiovascular: Negative.  Negative for chest pain and palpitations.  Gastrointestinal:  Negative for abdominal pain, nausea and vomiting.  Genitourinary: Negative.   Musculoskeletal:  Positive for joint pain (Left hip pain).  Skin: Negative.  Negative for rash.  Neurological: Negative.  Negative for dizziness and  headaches.  All other systems reviewed and are negative.   Vitals:   05/18/23 1051  BP: (!) 146/110  Pulse: 70  Temp: 98.4 F (36.9 C)  SpO2: 98%    Physical Exam Vitals reviewed.  Constitutional:      Appearance: Normal appearance.  HENT:     Head: Normocephalic.  Eyes:     Extraocular Movements: Extraocular movements intact.  Cardiovascular:     Rate and Rhythm: Normal rate.  Pulmonary:     Effort: Pulmonary effort is normal.  Musculoskeletal:     Comments: Left hip: Painful range of motion.  Mild lateral tenderness  Skin:    General: Skin is warm and dry.  Neurological:     Mental Status: He is alert and  oriented to person, place, and time.  Psychiatric:        Mood and Affect: Mood normal.        Behavior: Behavior normal.    DG HIP UNILAT W OR W/O PELVIS 2-3 VIEWS LEFT  Result Date: 05/18/2023 CLINICAL DATA:  Left hip pain EXAM: DG HIP (WITH OR WITHOUT PELVIS) 2-3V LEFT COMPARISON:  None Available. FINDINGS: There is previous arthroplasty in both hips. No recent fracture or dislocation is seen. Degenerative changes are noted in lower lumbar spine. IMPRESSION: Previous arthroplasty in both hips. No recent fracture or dislocation is seen in left hip. Lumbar spondylosis. Electronically Signed   By: Ernie Avena M.D.   On: 05/18/2023 11:34     ASSESSMENT & PLAN: A total of 43 minutes was spent with the patient and counseling/coordination of care regarding preparing for this visit, review of most recent office visit notes, review of chronic medical conditions under management, review of all medications and changes made, cardiovascular risks associated with uncontrolled hypertension, differential diagnosis of left hip pain, review of x-ray report done today, pain management, need for orthopedic evaluation, prognosis, documentation, and need for follow-up.  Problem List Items Addressed This Visit       Cardiovascular and Mediastinum   Essential hypertension    Uncontrolled hypertension. Cardiovascular risk associated with uncontrolled hypertension discussed Recommend to restart amlodipine 5 mg and lisinopril 20 mg daily Follow-up in 3 to 4 weeks      Relevant Medications   amLODipine (NORVASC) 5 MG tablet   lisinopril (ZESTRIL) 20 MG tablet     Other   History of bilateral hip replacements   Relevant Orders   DG HIP UNILAT W OR W/O PELVIS 2-3 VIEWS LEFT (Completed)   Left hip pain - Primary    History of bilateral hip replacements New onset left hip pain Pain management discussed May use Tylenol for mild to moderate pain and tramadol for moderate to severe pain Needs orthopedic  evaluation States he will follow-up with [facility] Dr. Barry Dienes in 1 to 2 weeks X-ray done today.  Report reviewed      Relevant Medications   traMADol (ULTRAM) 50 MG tablet   Other Relevant Orders   DG HIP UNILAT W OR W/O PELVIS 2-3 VIEWS LEFT (Completed)   Patient Instructions  Hip Pain The hip is the joint between the upper legs and the lower pelvis. The bones, cartilage, tendons, and muscles of your hip joint support your body and allow you to move around. Hip pain can range from a minor ache to severe pain in one or both of your hips. The pain may be felt on the inside of the hip joint near the groin, or on the outside near the buttocks  and upper thigh. You may also have swelling or stiffness in your hip area. Follow these instructions at home: Managing pain, stiffness, and swelling     If told, put ice on the painful area. Put ice in a plastic bag. Place a towel between your skin and the bag. Leave the ice on for 20 minutes, 2-3 times a day. If told, apply heat to the affected area as often as told by your health care provider. Use the heat source that your provider recommends, such as a moist heat pack or a heating pad. Place a towel between your skin and the heat source. Leave the heat on for 20-30 minutes. If your skin turns bright red, remove the ice or heat right away to prevent skin damage. The risk of damage is higher if you cannot feel pain, heat, or cold. Activity Do exercises as told by your provider. Avoid activities that cause pain. General instructions  Take over-the-counter and prescription medicines only as told by your provider. Keep a journal of your symptoms. Write down: How often you have hip pain. The location of your pain. What the pain feels like. What makes the pain worse. Sleep with a pillow between your legs on your most comfortable side. Keep all follow-up visits. Your provider will monitor your pain and activity. Contact a health care provider  if: You cannot put weight on your leg. Your pain or swelling gets worse after a week. It gets harder to walk. You have a fever. Get help right away if: You fall. You have a sudden increase in pain and swelling in your hip. Your hip is red or swollen or very tender to touch. This information is not intended to replace advice given to you by your health care provider. Make sure you discuss any questions you have with your health care provider. Document Revised: 08/05/2022 Document Reviewed: 08/05/2022 Elsevier Patient Education  2024 Elsevier Inc.    Edwina Barth, MD [facility]

## 2023-05-18 NOTE — Assessment & Plan Note (Signed)
Uncontrolled hypertension. Cardiovascular risk associated with uncontrolled hypertension discussed Recommend to restart amlodipine 5 mg and lisinopril 20 mg daily Follow-up in 3 to 4 weeks

## 2023-05-18 NOTE — Assessment & Plan Note (Signed)
History of bilateral hip replacements New onset left hip pain Pain management discussed May use Tylenol for mild to moderate pain and tramadol for moderate to severe pain Needs orthopedic evaluation States he will follow-up with [facility] Dr. Barry Dienes in 1 to 2 weeks X-ray done today.  Report reviewed

## 2023-07-30 ENCOUNTER — Encounter (INDEPENDENT_AMBULATORY_CARE_PROVIDER_SITE_OTHER): Payer: Self-pay

## 2023-08-05 ENCOUNTER — Ambulatory Visit: Payer: 59 | Admitting: Emergency Medicine

## 2023-08-05 ENCOUNTER — Encounter: Payer: Self-pay | Admitting: Emergency Medicine

## 2023-08-05 VITALS — BP 190/96 | HR 68 | Temp 98.0°F | Ht 72.0 in | Wt 190.6 lb

## 2023-08-05 DIAGNOSIS — J029 Acute pharyngitis, unspecified: Secondary | ICD-10-CM | POA: Diagnosis not present

## 2023-08-05 DIAGNOSIS — F17200 Nicotine dependence, unspecified, uncomplicated: Secondary | ICD-10-CM

## 2023-08-05 DIAGNOSIS — I10 Essential (primary) hypertension: Secondary | ICD-10-CM | POA: Diagnosis not present

## 2023-08-05 DIAGNOSIS — J0100 Acute maxillary sinusitis, unspecified: Secondary | ICD-10-CM | POA: Diagnosis not present

## 2023-08-05 MED ORDER — AMOXICILLIN-POT CLAVULANATE 875-125 MG PO TABS
1.0000 | ORAL_TABLET | Freq: Two times a day (BID) | ORAL | 0 refills | Status: AC
Start: 2023-08-05 — End: 2023-08-12

## 2023-08-05 MED ORDER — VALSARTAN 160 MG PO TABS: 160.0000 mg | ORAL_TABLET | Freq: Every day | ORAL | 3 refills | Status: DC

## 2023-08-05 NOTE — Progress Notes (Signed)
Travis Trevino 67 y.o.   Chief Complaint  Patient presents with   Sore Throat    3 weeks, sneezing, occasional coughing, ear clogged     HISTORY OF PRESENT ILLNESS: This is a 67 y.o. male complaining of 4 to 5 weeks of upper respiratory symptoms including occasional sneezing, cough and sinus congestion.  Concerned about possible bacterial infection.  Mostly concerned about sore throat.  Chronic smoker. Hypertension.  Not taking medications as prescribed.  Sore Throat  Pertinent negatives include no abdominal pain, coughing, diarrhea, headaches, shortness of breath or vomiting.     Prior to Admission medications   Medication Sig Start Date End Date Taking? Authorizing Provider  amLODipine (NORVASC) 5 MG tablet Take 1 tablet (5 mg total) by mouth daily. Patient not taking: Reported on 08/05/2023 05/18/23 08/16/23  Georgina Quint, MD  Robyne Askew Paint 1.5 % LIQD Apply 1 application topically daily. Patient not taking: Reported on 08/05/2023 05/25/18   Hyatt, Max T, DPM  lisinopril (ZESTRIL) 20 MG tablet Take 1 tablet (20 mg total) by mouth daily. Patient not taking: Reported on 08/05/2023 05/18/23   Georgina Quint, MD  methylPREDNISolone (MEDROL DOSEPAK) 4 MG TBPK tablet As directed Patient not taking: Reported on 08/05/2023 07/30/21   Glynn Octave, MD  rosuvastatin (CRESTOR) 20 MG tablet Take 1 tablet (20 mg total) by mouth daily. Patient not taking: Reported on 08/05/2023 11/28/20   Georgina Quint, MD    No Known Allergies  Patient Active Problem List   Diagnosis Date Noted   History of bilateral hip replacements 05/18/2023   Left hip pain 05/18/2023   Prediabetes 12/22/2019   Dyslipidemia 12/22/2019   Essential hypertension 06/30/2018   RLS (restless legs syndrome) 11/15/2016    Past Medical History:  Diagnosis Date   Arthritis    Heart murmur     Past Surgical History:  Procedure Laterality Date   KNEE ARTHROSCOPY W/ MENISCAL REPAIR Left    06/2016     Social History   Socioeconomic History   Marital status: Married    Spouse name: Not on file   Number of children: Not on file   Years of education: Not on file   Highest education level: Not on file  Occupational History   Not on file  Tobacco Use   Smoking status: Some Days   Smokeless tobacco: Never  Substance and Sexual Activity   Alcohol use: Yes    Comment: social   Drug use: Never   Sexual activity: Yes  Other Topics Concern   Not on file  Social History Narrative   Not on file   Social Determinants of Health   Financial Resource Strain: Not on file  Food Insecurity: Not on file  Transportation Needs: Not on file  Physical Activity: Not on file  Stress: Not on file  Social Connections: Not on file  Intimate Partner Violence: Not on file    Family History  Problem Relation Age of Onset   Cancer Maternal Aunt      Review of Systems  Constitutional: Negative.  Negative for chills and fever.  HENT:  Positive for sore throat.   Respiratory: Negative.  Negative for cough and shortness of breath.   Cardiovascular: Negative.  Negative for chest pain and palpitations.  Gastrointestinal:  Negative for abdominal pain, diarrhea, nausea and vomiting.  Genitourinary: Negative.  Negative for dysuria and hematuria.  Skin: Negative.  Negative for rash.  Neurological: Negative.  Negative for dizziness and headaches.  All  other systems reviewed and are negative.   Vitals:   08/05/23 0947  BP: (!) 190/96  Pulse: 68  Temp: 98 F (36.7 C)  SpO2: 97%    Physical Exam Vitals reviewed.  Constitutional:      Appearance: Normal appearance. He is normal weight.  HENT:     Head: Normocephalic.     Right Ear: Tympanic membrane, ear canal and external ear normal.     Left Ear: Tympanic membrane, ear canal and external ear normal.     Nose: No congestion.     Mouth/Throat:     Mouth: Mucous membranes are moist.     Pharynx: Oropharynx is clear. Posterior  oropharyngeal erythema present.     Comments: Enlarged and hyperemic tonsils Eyes:     Extraocular Movements: Extraocular movements intact.     Conjunctiva/sclera: Conjunctivae normal.     Pupils: Pupils are equal, round, and reactive to light.  Cardiovascular:     Rate and Rhythm: Normal rate and regular rhythm.     Pulses: Normal pulses.     Heart sounds: Normal heart sounds.  Pulmonary:     Effort: Pulmonary effort is normal.     Breath sounds: Normal breath sounds.  Musculoskeletal:     Cervical back: No tenderness.  Lymphadenopathy:     Cervical: Cervical adenopathy present.  Skin:    General: Skin is warm and dry.     Capillary Refill: Capillary refill takes less than 2 seconds.  Neurological:     General: No focal deficit present.     Mental Status: He is alert and oriented to person, place, and time.  Psychiatric:        Mood and Affect: Mood normal.        Behavior: Behavior normal.      ASSESSMENT & PLAN: A total of 44 minutes was spent with the patient and counseling/coordination of care regarding preparing for this visit, review of most recent office visit notes, cardiovascular risks associated with uncontrolled hypertension, review of all medications and changes made, education on nutrition, prognosis, documentation, need for follow-up.  Problem List Items Addressed This Visit       Cardiovascular and Mediastinum   Essential hypertension - Primary    Uncontrolled hypertension. Noncompliant with medications. Continues to smoke. Cardiovascular risks associated with uncontrolled hypertension discussed Diet and nutrition discussed Recommend to restart amlodipine 5 mg daily.  Recommend to change lisinopril to valsartan 160 mg daily Follow-up in 3 months      Relevant Medications   valsartan (DIOVAN) 160 MG tablet     Respiratory   Acute non-recurrent maxillary sinusitis    Smoking not helping. Recommend Augmentin 875 mg twice a day for 7 days Recommend to  use saline nasal sprays several times a day      Relevant Medications   amoxicillin-clavulanate (AUGMENTIN) 875-125 MG tablet   Acute pharyngitis    Recommend to start Augmentin 875 mg twice a day for 7 days      Relevant Medications   amoxicillin-clavulanate (AUGMENTIN) 875-125 MG tablet     Other   Current smoker    Cardiovascular and cancer risks associated with smoking discussed. Smoking cessation advice given.      Patient Instructions  Hypertension, Adult High blood pressure (hypertension) is when the force of blood pumping through the arteries is too strong. The arteries are the blood vessels that carry blood from the heart throughout the body. Hypertension forces the heart to work harder to pump blood and  may cause arteries to become narrow or stiff. Untreated or uncontrolled hypertension can lead to a heart attack, heart failure, a stroke, kidney disease, and other problems. A blood pressure reading consists of a higher number over a lower number. Ideally, your blood pressure should be below 120/80. The first ("top") number is called the systolic pressure. It is a measure of the pressure in your arteries as your heart beats. The second ("bottom") number is called the diastolic pressure. It is a measure of the pressure in your arteries as the heart relaxes. What are the causes? The exact cause of this condition is not known. There are some conditions that result in high blood pressure. What increases the risk? Certain factors may make you more likely to develop high blood pressure. Some of these risk factors are under your control, including: Smoking. Not getting enough exercise or physical activity. Being overweight. Having too much fat, sugar, calories, or salt (sodium) in your diet. Drinking too much alcohol. Other risk factors include: Having a personal history of heart disease, diabetes, high cholesterol, or kidney disease. Stress. Having a family history of high blood  pressure and high cholesterol. Having obstructive sleep apnea. Age. The risk increases with age. What are the signs or symptoms? High blood pressure may not cause symptoms. Very high blood pressure (hypertensive crisis) may cause: Headache. Fast or irregular heartbeats (palpitations). Shortness of breath. Nosebleed. Nausea and vomiting. Vision changes. Severe chest pain, dizziness, and seizures. How is this diagnosed? This condition is diagnosed by measuring your blood pressure while you are seated, with your arm resting on a flat surface, your legs uncrossed, and your feet flat on the floor. The cuff of the blood pressure monitor will be placed directly against the skin of your upper arm at the level of your heart. Blood pressure should be measured at least twice using the same arm. Certain conditions can cause a difference in blood pressure between your right and left arms. If you have a high blood pressure reading during one visit or you have normal blood pressure with other risk factors, you may be asked to: Return on a different day to have your blood pressure checked again. Monitor your blood pressure at home for 1 week or longer. If you are diagnosed with hypertension, you may have other blood or imaging tests to help your health care provider understand your overall risk for other conditions. How is this treated? This condition is treated by making healthy lifestyle changes, such as eating healthy foods, exercising more, and reducing your alcohol intake. You may be referred for counseling on a healthy diet and physical activity. Your health care provider may prescribe medicine if lifestyle changes are not enough to get your blood pressure under control and if: Your systolic blood pressure is above 130. Your diastolic blood pressure is above 80. Your personal target blood pressure may vary depending on your medical conditions, your age, and other factors. Follow these instructions at  home: Eating and drinking  Eat a diet that is high in fiber and potassium, and low in sodium, added sugar, and fat. An example of this eating plan is called the DASH diet. DASH stands for Dietary Approaches to Stop Hypertension. To eat this way: Eat plenty of fresh fruits and vegetables. Try to fill one half of your plate at each meal with fruits and vegetables. Eat whole grains, such as whole-wheat pasta, brown rice, or whole-grain bread. Fill about one fourth of your plate with whole grains. Eat  or drink low-fat dairy products, such as skim milk or low-fat yogurt. Avoid fatty cuts of meat, processed or cured meats, and poultry with skin. Fill about one fourth of your plate with lean proteins, such as fish, chicken without skin, beans, eggs, or tofu. Avoid pre-made and processed foods. These tend to be higher in sodium, added sugar, and fat. Reduce your daily sodium intake. Many people with hypertension should eat less than 1,500 mg of sodium a day. Do not drink alcohol if: Your health care provider tells you not to drink. You are pregnant, may be pregnant, or are planning to become pregnant. If you drink alcohol: Limit how much you have to: 0-1 drink a day for women. 0-2 drinks a day for men. Know how much alcohol is in your drink. In the [place] one drink equals one 12 oz bottle of beer (355 mL), one 5 oz glass of wine (148 mL), or one 1 oz glass of hard liquor (44 mL). Lifestyle  Work with your health care provider to maintain a healthy body weight or to lose weight. Ask what an ideal weight is for you. Get at least 30 minutes of exercise that causes your heart to beat faster (aerobic exercise) most days of the week. Activities may include walking, swimming, or biking. Include exercise to strengthen your muscles (resistance exercise), such as Pilates or lifting weights, as part of your weekly exercise routine. Try to do these types of exercises for 30 minutes at least 3 days a week. Do not  use any products that contain nicotine or tobacco. These products include cigarettes, chewing tobacco, and vaping devices, such as e-cigarettes. If you need help quitting, ask your health care provider. Monitor your blood pressure at home as told by your health care provider. Keep all follow-up visits. This is important. Medicines Take over-the-counter and prescription medicines only as told by your health care provider. Follow directions carefully. Blood pressure medicines must be taken as prescribed. Do not skip doses of blood pressure medicine. Doing this puts you at risk for problems and can make the medicine less effective. Ask your health care provider about side effects or reactions to medicines that you should watch for. Contact a health care provider if you: Think you are having a reaction to a medicine you are taking. Have headaches that keep coming back (recurring). Feel dizzy. Have swelling in your ankles. Have trouble with your vision. Get help right away if you: Develop a severe headache or confusion. Have unusual weakness or numbness. Feel faint. Have severe pain in your chest or abdomen. Vomit repeatedly. Have trouble breathing. These symptoms may be an emergency. Get help right away. Call 911. Do not wait to see if the symptoms will go away. Do not drive yourself to the hospital. Summary Hypertension is when the force of blood pumping through your arteries is too strong. If this condition is not controlled, it may put you at risk for serious complications. Your personal target blood pressure may vary depending on your medical conditions, your age, and other factors. For most people, a normal blood pressure is less than 120/80. Hypertension is treated with lifestyle changes, medicines, or a combination of both. Lifestyle changes include losing weight, eating a healthy, low-sodium diet, exercising more, and limiting alcohol. This information is not intended to replace advice  given to you by your health care provider. Make sure you discuss any questions you have with your health care provider. Document Revised: 10/08/2021 Document Reviewed: 10/08/2021 Elsevier Patient  Education  2024 Elsevier Inc.    Edwina Barth, MD [facility]

## 2023-08-05 NOTE — Patient Instructions (Signed)
Hypertension, Adult High blood pressure (hypertension) is when the force of blood pumping through the arteries is too strong. The arteries are the blood vessels that carry blood from the heart throughout the body. Hypertension forces the heart to work harder to pump blood and may cause arteries to become narrow or stiff. Untreated or uncontrolled hypertension can lead to a heart attack, heart failure, a stroke, kidney disease, and other problems. A blood pressure reading consists of a higher number over a lower number. Ideally, your blood pressure should be below 120/80. The first ("top") number is called the systolic pressure. It is a measure of the pressure in your arteries as your heart beats. The second ("bottom") number is called the diastolic pressure. It is a measure of the pressure in your arteries as the heart relaxes. What are the causes? The exact cause of this condition is not known. There are some conditions that result in high blood pressure. What increases the risk? Certain factors may make you more likely to develop high blood pressure. Some of these risk factors are under your control, including: Smoking. Not getting enough exercise or physical activity. Being overweight. Having too much fat, sugar, calories, or salt (sodium) in your diet. Drinking too much alcohol. Other risk factors include: Having a personal history of heart disease, diabetes, high cholesterol, or kidney disease. Stress. Having a family history of high blood pressure and high cholesterol. Having obstructive sleep apnea. Age. The risk increases with age. What are the signs or symptoms? High blood pressure may not cause symptoms. Very high blood pressure (hypertensive crisis) may cause: Headache. Fast or irregular heartbeats (palpitations). Shortness of breath. Nosebleed. Nausea and vomiting. Vision changes. Severe chest pain, dizziness, and seizures. How is this diagnosed? This condition is diagnosed by  measuring your blood pressure while you are seated, with your arm resting on a flat surface, your legs uncrossed, and your feet flat on the floor. The cuff of the blood pressure monitor will be placed directly against the skin of your upper arm at the level of your heart. Blood pressure should be measured at least twice using the same arm. Certain conditions can cause a difference in blood pressure between your right and left arms. If you have a high blood pressure reading during one visit or you have normal blood pressure with other risk factors, you may be asked to: Return on a different day to have your blood pressure checked again. Monitor your blood pressure at home for 1 week or longer. If you are diagnosed with hypertension, you may have other blood or imaging tests to help your health care provider understand your overall risk for other conditions. How is this treated? This condition is treated by making healthy lifestyle changes, such as eating healthy foods, exercising more, and reducing your alcohol intake. You may be referred for counseling on a healthy diet and physical activity. Your health care provider may prescribe medicine if lifestyle changes are not enough to get your blood pressure under control and if: Your systolic blood pressure is above 130. Your diastolic blood pressure is above 80. Your personal target blood pressure may vary depending on your medical conditions, your age, and other factors. Follow these instructions at home: Eating and drinking  Eat a diet that is high in fiber and potassium, and low in sodium, added sugar, and fat. An example of this eating plan is called the DASH diet. DASH stands for Dietary Approaches to Stop Hypertension. To eat this way: Eat   plenty of fresh fruits and vegetables. Try to fill one half of your plate at each meal with fruits and vegetables. Eat whole grains, such as whole-wheat pasta, brown rice, or whole-grain bread. Fill about one  fourth of your plate with whole grains. Eat or drink low-fat dairy products, such as skim milk or low-fat yogurt. Avoid fatty cuts of meat, processed or cured meats, and poultry with skin. Fill about one fourth of your plate with lean proteins, such as fish, chicken without skin, beans, eggs, or tofu. Avoid pre-made and processed foods. These tend to be higher in sodium, added sugar, and fat. Reduce your daily sodium intake. Many people with hypertension should eat less than 1,500 mg of sodium a day. Do not drink alcohol if: Your health care provider tells you not to drink. You are pregnant, may be pregnant, or are planning to become pregnant. If you drink alcohol: Limit how much you have to: 0-1 drink a day for women. 0-2 drinks a day for men. Know how much alcohol is in your drink. In the [place] one drink equals one 12 oz bottle of beer (355 mL), one 5 oz glass of wine (148 mL), or one 1 oz glass of hard liquor (44 mL). Lifestyle  Work with your health care provider to maintain a healthy body weight or to lose weight. Ask what an ideal weight is for you. Get at least 30 minutes of exercise that causes your heart to beat faster (aerobic exercise) most days of the week. Activities may include walking, swimming, or biking. Include exercise to strengthen your muscles (resistance exercise), such as Pilates or lifting weights, as part of your weekly exercise routine. Try to do these types of exercises for 30 minutes at least 3 days a week. Do not use any products that contain nicotine or tobacco. These products include cigarettes, chewing tobacco, and vaping devices, such as e-cigarettes. If you need help quitting, ask your health care provider. Monitor your blood pressure at home as told by your health care provider. Keep all follow-up visits. This is important. Medicines Take over-the-counter and prescription medicines only as told by your health care provider. Follow directions carefully. Blood  pressure medicines must be taken as prescribed. Do not skip doses of blood pressure medicine. Doing this puts you at risk for problems and can make the medicine less effective. Ask your health care provider about side effects or reactions to medicines that you should watch for. Contact a health care provider if you: Think you are having a reaction to a medicine you are taking. Have headaches that keep coming back (recurring). Feel dizzy. Have swelling in your ankles. Have trouble with your vision. Get help right away if you: Develop a severe headache or confusion. Have unusual weakness or numbness. Feel faint. Have severe pain in your chest or abdomen. Vomit repeatedly. Have trouble breathing. These symptoms may be an emergency. Get help right away. Call 911. Do not wait to see if the symptoms will go away. Do not drive yourself to the hospital. Summary Hypertension is when the force of blood pumping through your arteries is too strong. If this condition is not controlled, it may put you at risk for serious complications. Your personal target blood pressure may vary depending on your medical conditions, your age, and other factors. For most people, a normal blood pressure is less than 120/80. Hypertension is treated with lifestyle changes, medicines, or a combination of both. Lifestyle changes include losing weight, eating a healthy,   low-sodium diet, exercising more, and limiting alcohol. This information is not intended to replace advice given to you by your health care provider. Make sure you discuss any questions you have with your health care provider. Document Revised: 10/08/2021 Document Reviewed: 10/08/2021 Elsevier Patient Education  2024 [employer].  

## 2023-08-05 NOTE — Assessment & Plan Note (Signed)
Cardiovascular and cancer risks associated with smoking discussed. °Smoking cessation advice given. °

## 2023-08-05 NOTE — Assessment & Plan Note (Signed)
Recommend to start Augmentin 875 mg twice a day for 7 days

## 2023-08-05 NOTE — Assessment & Plan Note (Signed)
Smoking not helping. Recommend Augmentin 875 mg twice a day for 7 days Recommend to use saline nasal sprays several times a day

## 2023-08-05 NOTE — Addendum Note (Signed)
Addended by: Evie Lacks on: 08/05/2023 05:01 PM   Modules accepted: Orders

## 2023-08-05 NOTE — Assessment & Plan Note (Signed)
Uncontrolled hypertension. Noncompliant with medications. Continues to smoke. Cardiovascular risks associated with uncontrolled hypertension discussed Diet and nutrition discussed Recommend to restart amlodipine 5 mg daily.  Recommend to change lisinopril to valsartan 160 mg daily Follow-up in 3 months

## 2023-08-26 ENCOUNTER — Ambulatory Visit: Payer: 59 | Admitting: Emergency Medicine

## 2023-08-27 IMAGING — CR DG FOOT COMPLETE 3+V*L*
3 series · 3 of 3 positions shown · non-contrast
Comparison: None.

CLINICAL DATA: Injury

EXAM:
LEFT FOOT - COMPLETE 3+ VIEW

[x foot ap left]
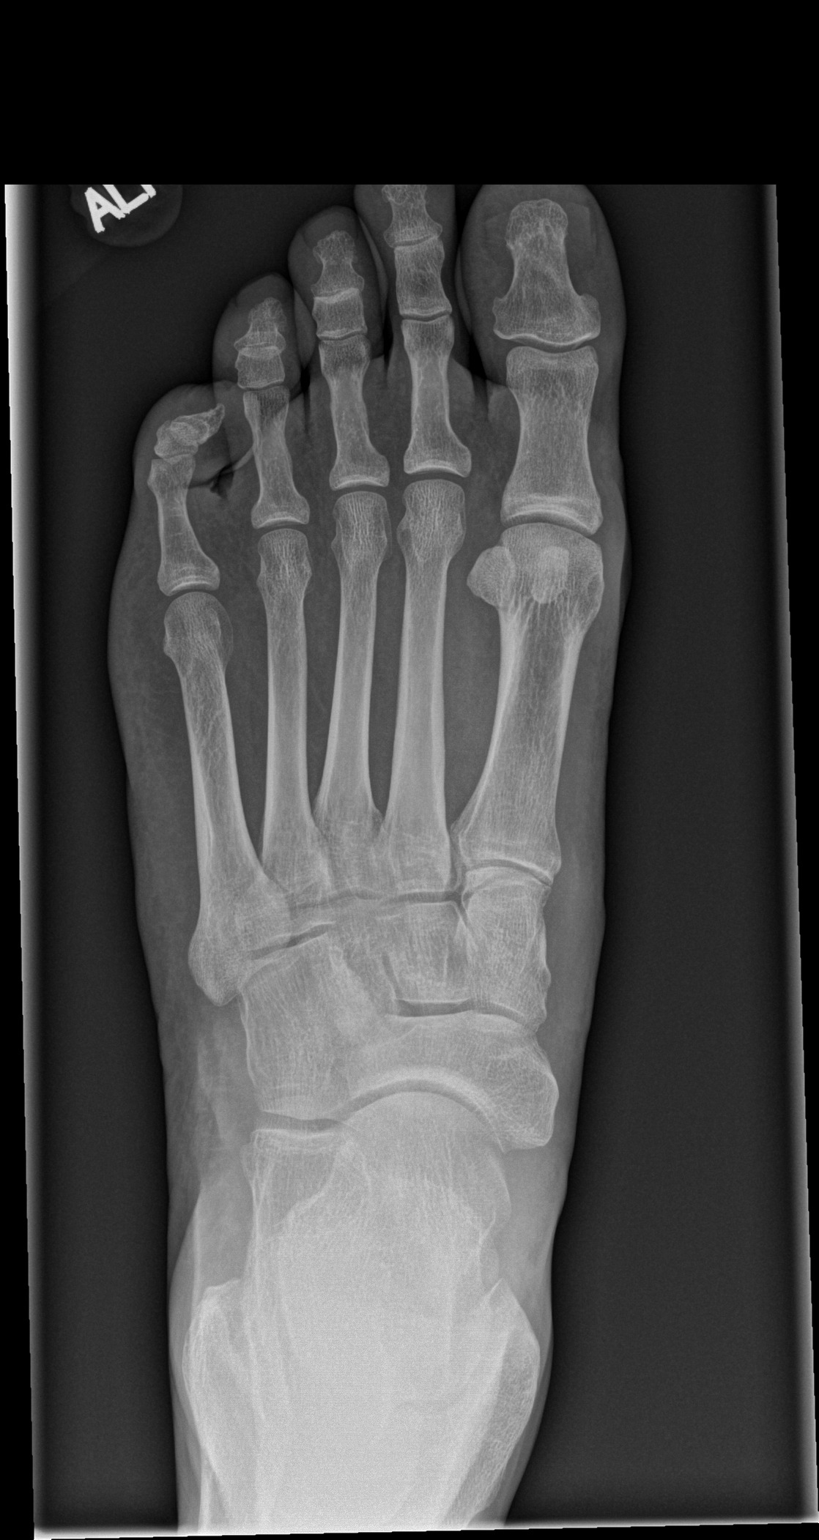

[x foot obl left]
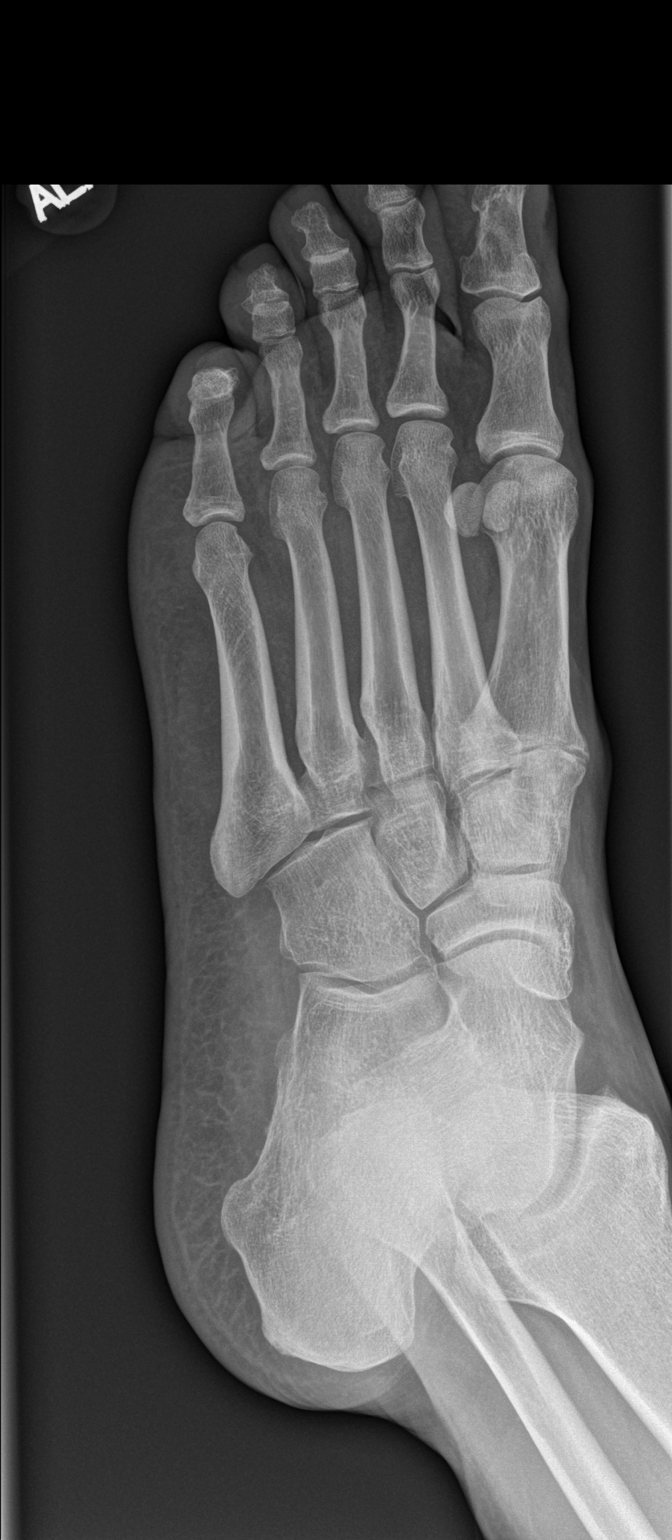

[x foot lat left]
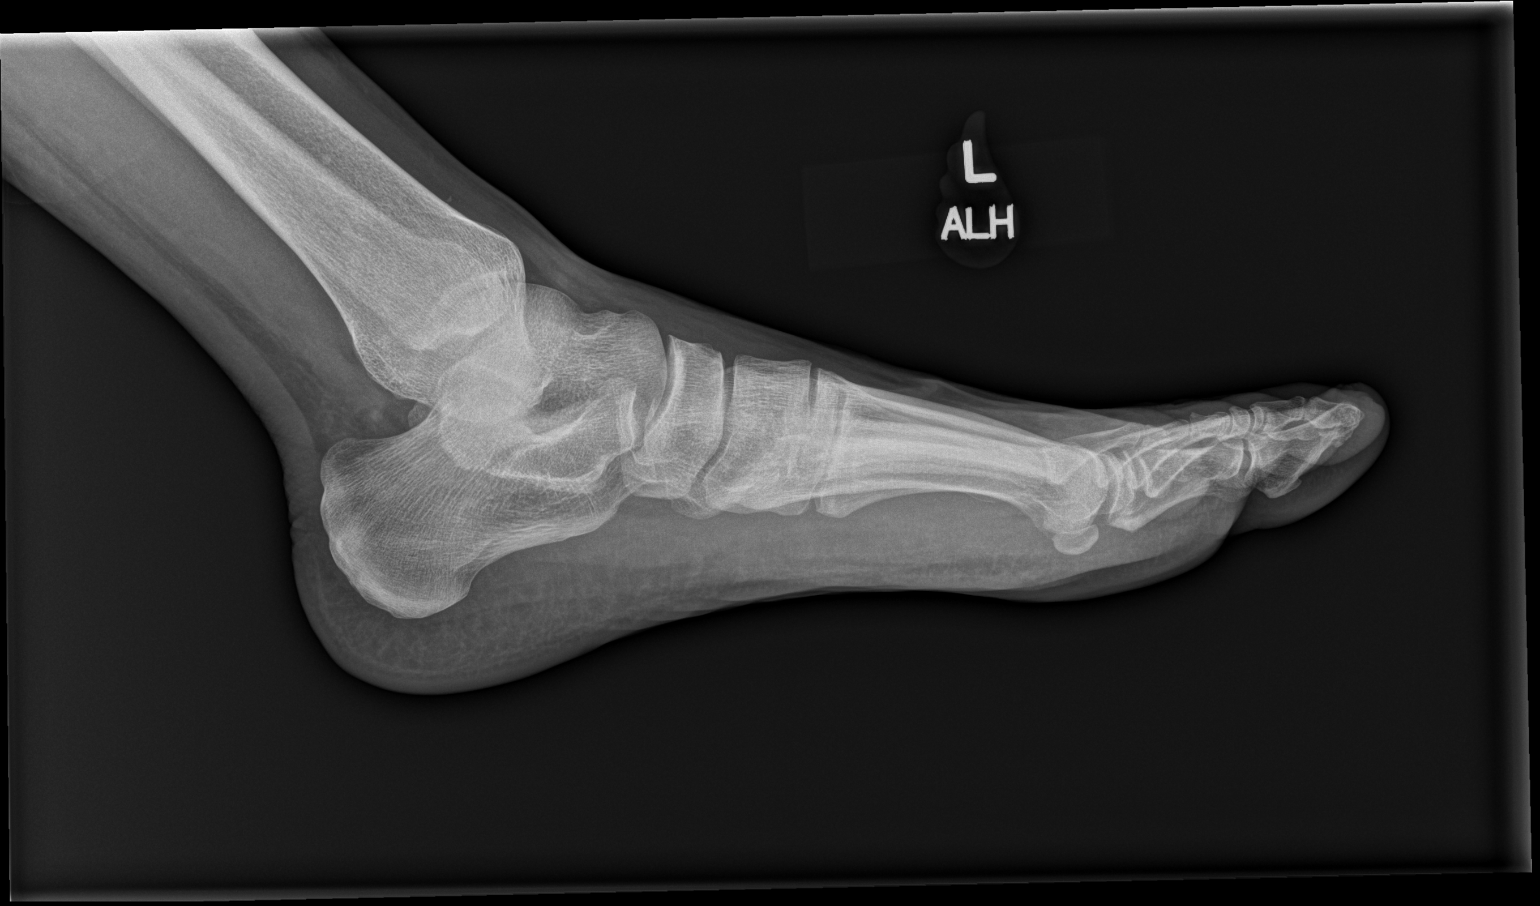

[3 of 3 positions shown; findings below may reference images not displayed]

FINDINGS: Normal alignment. No acute fracture. Normal mineralization. The soft
tissues are unremarkable. No joint effusion.
IMPRESSION: No malalignment or acute fracture.

## 2023-10-06 ENCOUNTER — Telehealth: Payer: Self-pay

## 2023-10-06 NOTE — Telephone Encounter (Signed)
Spoke with patient who states a pack of cigaretts may last him 4 months.  He denies being a daily smoker.  For his 'entire life of smoking' he states he never smoked regularly and thus does not meet criteria of 20 pack years of smoking.  He have never smoked 'everyday' just a few times a week he may smoke a cigarette and sometimes two.  Advised he is not eligible for this type of LDCT due to light smoking history.  Will message referring provider.  Patient would not be opposed to a CT chest wo, if the referring provider recommends.  Referral to lung cancer screening is closed

## 2023-11-05 ENCOUNTER — Ambulatory Visit: Payer: 59 | Admitting: Emergency Medicine

## 2023-11-09 ENCOUNTER — Encounter: Payer: Self-pay | Admitting: Emergency Medicine

## 2023-11-09 ENCOUNTER — Ambulatory Visit: Payer: 59 | Admitting: Emergency Medicine

## 2023-11-09 VITALS — BP 130/78 | HR 78 | Temp 98.4°F | Ht 72.0 in | Wt 188.8 lb

## 2023-11-09 DIAGNOSIS — Z9189 Other specified personal risk factors, not elsewhere classified: Secondary | ICD-10-CM

## 2023-11-09 DIAGNOSIS — F17200 Nicotine dependence, unspecified, uncomplicated: Secondary | ICD-10-CM

## 2023-11-09 DIAGNOSIS — I10 Essential (primary) hypertension: Secondary | ICD-10-CM | POA: Diagnosis not present

## 2023-11-09 DIAGNOSIS — E785 Hyperlipidemia, unspecified: Secondary | ICD-10-CM

## 2023-11-09 MED ORDER — ROSUVASTATIN CALCIUM 20 MG PO TABS: 20.0000 mg | ORAL_TABLET | Freq: Every day | ORAL | 3 refills | Status: DC

## 2023-11-09 MED ORDER — AMLODIPINE BESYLATE 5 MG PO TABS: 5.0000 mg | ORAL_TABLET | Freq: Every day | ORAL | 3 refills | Status: DC

## 2023-11-09 MED ORDER — VALSARTAN 160 MG PO TABS: 160.0000 mg | ORAL_TABLET | Freq: Every day | ORAL | 3 refills | Status: DC

## 2023-11-09 NOTE — Assessment & Plan Note (Signed)
Diet and nutrition discussed Recommend rosuvastatin 20 mg daily.

## 2023-11-09 NOTE — Progress Notes (Signed)
Travis Trevino 67 y.o.   Chief Complaint  Patient presents with   Follow-up    3 month f/u for HTN     HISTORY OF PRESENT ILLNESS: This is a 67 y.o. A1A male here for follow-up of hypertension Doing well.  Has no complaints or medical concerns today.  HPI   Prior to Admission medications   Medication Sig Start Date End Date Taking? Authorizing Provider  valsartan (DIOVAN) 160 MG tablet Take 1 tablet (160 mg total) by mouth daily. 08/05/23  Yes Kavion Mancinas, Eilleen Kempf, MD  amLODipine (NORVASC) 5 MG tablet Take 1 tablet (5 mg total) by mouth daily. Patient not taking: Reported on 08/05/2023 05/18/23 08/16/23  Georgina Quint, MD  Robyne Askew Paint 1.5 % LIQD Apply 1 application topically daily. Patient not taking: Reported on 08/05/2023 05/25/18   Ernestene Kiel T, DPM  methylPREDNISolone (MEDROL DOSEPAK) 4 MG TBPK tablet As directed Patient not taking: Reported on 08/05/2023 07/30/21   Glynn Octave, MD  rosuvastatin (CRESTOR) 20 MG tablet Take 1 tablet (20 mg total) by mouth daily. Patient not taking: Reported on 08/05/2023 11/28/20   Georgina Quint, MD    No Known Allergies  Patient Active Problem List   Diagnosis Date Noted   Acute non-recurrent maxillary sinusitis 08/05/2023   Current smoker 08/05/2023   Acute pharyngitis 08/05/2023   History of bilateral hip replacements 05/18/2023   Left hip pain 05/18/2023   Prediabetes 12/22/2019   Dyslipidemia 12/22/2019   Essential hypertension 06/30/2018   RLS (restless legs syndrome) 11/15/2016    Past Medical History:  Diagnosis Date   Arthritis    Heart murmur     Past Surgical History:  Procedure Laterality Date   KNEE ARTHROSCOPY W/ MENISCAL REPAIR Left    06/2016    Social History   Socioeconomic History   Marital status: Married    Spouse name: Not on file   Number of children: Not on file   Years of education: Not on file   Highest education level: Not on file  Occupational History   Not on file   Tobacco Use   Smoking status: Some Days   Smokeless tobacco: Never  Substance and Sexual Activity   Alcohol use: Yes    Comment: social   Drug use: Never   Sexual activity: Yes  Other Topics Concern   Not on file  Social History Narrative   Not on file   Social Determinants of Health   Financial Resource Strain: Not on file  Food Insecurity: Not on file  Transportation Needs: Not on file  Physical Activity: Not on file  Stress: Not on file  Social Connections: Not on file  Intimate Partner Violence: Not on file    Family History  Problem Relation Age of Onset   Cancer Maternal Aunt      Review of Systems  Constitutional: Negative.  Negative for chills and fever.  HENT: Negative.  Negative for congestion and sore throat.   Respiratory: Negative.  Negative for cough and shortness of breath.   Cardiovascular: Negative.  Negative for chest pain and palpitations.  Gastrointestinal:  Negative for abdominal pain, diarrhea, nausea and vomiting.  Genitourinary: Negative.  Negative for dysuria and hematuria.  Skin: Negative.  Negative for rash.  Neurological: Negative.  Negative for dizziness and headaches.  All other systems reviewed and are negative.   Vitals:   11/09/23 1026  BP: 130/78  Pulse: 78  Temp: 98.4 F (36.9 C)  SpO2: 97%  Physical Exam Vitals reviewed.  Constitutional:      Appearance: Normal appearance.  HENT:     Head: Normocephalic.     Mouth/Throat:     Mouth: Mucous membranes are moist.     Pharynx: Oropharynx is clear.  Eyes:     Extraocular Movements: Extraocular movements intact.     Pupils: Pupils are equal, round, and reactive to light.  Cardiovascular:     Rate and Rhythm: Normal rate and regular rhythm.     Pulses: Normal pulses.     Heart sounds: Normal heart sounds.  Pulmonary:     Effort: Pulmonary effort is normal.     Breath sounds: Normal breath sounds.  Musculoskeletal:     Cervical back: No tenderness.   Lymphadenopathy:     Cervical: No cervical adenopathy.  Skin:    General: Skin is warm and dry.     Capillary Refill: Capillary refill takes less than 2 seconds.  Neurological:     General: No focal deficit present.     Mental Status: He is alert and oriented to person, place, and time.  Psychiatric:        Mood and Affect: Mood normal.        Behavior: Behavior normal.      ASSESSMENT & PLAN: A total of 42 minutes was spent with the patient and counseling/coordination of care regarding preparing for this visit, review of most recent office visit notes, review of multiple chronic medical conditions and their management, cardiovascular risks associated with hypertension, review of all medications, review of most recent bloodwork results, review of health maintenance items, education on nutrition, prognosis, documentation, and need for follow up. .  Problem List Items Addressed This Visit       Cardiovascular and Mediastinum   Essential hypertension - Primary    BP Readings from Last 3 Encounters:  11/09/23 130/78  08/05/23 (!) 190/96  05/18/23 (!) 162/102  Well-controlled hypertension Continue valsartan 160 mg and amlodipine 5 mg daily. Cardiovascular risks associated with hypertension discussed Diet and nutrition discussed       Relevant Medications   amLODipine (NORVASC) 5 MG tablet   rosuvastatin (CRESTOR) 20 MG tablet   valsartan (DIOVAN) 160 MG tablet     Other   Dyslipidemia    Diet and nutrition discussed Recommend rosuvastatin 20 mg daily.       Relevant Medications   rosuvastatin (CRESTOR) 20 MG tablet   Current smoker    Smoking less. Cardiovascular and cancer risks associated with smoking discussed Smoking cessation advice given      Other Visit Diagnoses     At increased risk for cardiovascular disease       Relevant Medications   rosuvastatin (CRESTOR) 20 MG tablet        Patient Instructions  Hypertension, Adult High blood pressure  (hypertension) is when the force of blood pumping through the arteries is too strong. The arteries are the blood vessels that carry blood from the heart throughout the body. Hypertension forces the heart to work harder to pump blood and may cause arteries to become narrow or stiff. Untreated or uncontrolled hypertension can lead to a heart attack, heart failure, a stroke, kidney disease, and other problems. A blood pressure reading consists of a higher number over a lower number. Ideally, your blood pressure should be below 120/80. The first ("top") number is called the systolic pressure. It is a measure of the pressure in your arteries as your heart beats. The second ("bottom")  number is called the diastolic pressure. It is a measure of the pressure in your arteries as the heart relaxes. What are the causes? The exact cause of this condition is not known. There are some conditions that result in high blood pressure. What increases the risk? Certain factors may make you more likely to develop high blood pressure. Some of these risk factors are under your control, including: Smoking. Not getting enough exercise or physical activity. Being overweight. Having too much fat, sugar, calories, or salt (sodium) in your diet. Drinking too much alcohol. Other risk factors include: Having a personal history of heart disease, diabetes, high cholesterol, or kidney disease. Stress. Having a family history of high blood pressure and high cholesterol. Having obstructive sleep apnea. Age. The risk increases with age. What are the signs or symptoms? High blood pressure may not cause symptoms. Very high blood pressure (hypertensive crisis) may cause: Headache. Fast or irregular heartbeats (palpitations). Shortness of breath. Nosebleed. Nausea and vomiting. Vision changes. Severe chest pain, dizziness, and seizures. How is this diagnosed? This condition is diagnosed by measuring your blood pressure while you  are seated, with your arm resting on a flat surface, your legs uncrossed, and your feet flat on the floor. The cuff of the blood pressure monitor will be placed directly against the skin of your upper arm at the level of your heart. Blood pressure should be measured at least twice using the same arm. Certain conditions can cause a difference in blood pressure between your right and left arms. If you have a high blood pressure reading during one visit or you have normal blood pressure with other risk factors, you may be asked to: Return on a different day to have your blood pressure checked again. Monitor your blood pressure at home for 1 week or longer. If you are diagnosed with hypertension, you may have other blood or imaging tests to help your health care provider understand your overall risk for other conditions. How is this treated? This condition is treated by making healthy lifestyle changes, such as eating healthy foods, exercising more, and reducing your alcohol intake. You may be referred for counseling on a healthy diet and physical activity. Your health care provider may prescribe medicine if lifestyle changes are not enough to get your blood pressure under control and if: Your systolic blood pressure is above 130. Your diastolic blood pressure is above 80. Your personal target blood pressure may vary depending on your medical conditions, your age, and other factors. Follow these instructions at home: Eating and drinking  Eat a diet that is high in fiber and potassium, and low in sodium, added sugar, and fat. An example of this eating plan is called the DASH diet. DASH stands for Dietary Approaches to Stop Hypertension. To eat this way: Eat plenty of fresh fruits and vegetables. Try to fill one half of your plate at each meal with fruits and vegetables. Eat whole grains, such as whole-wheat pasta, brown rice, or whole-grain bread. Fill about one fourth of your plate with whole  grains. Eat or drink low-fat dairy products, such as skim milk or low-fat yogurt. Avoid fatty cuts of meat, processed or cured meats, and poultry with skin. Fill about one fourth of your plate with lean proteins, such as fish, chicken without skin, beans, eggs, or tofu. Avoid pre-made and processed foods. These tend to be higher in sodium, added sugar, and fat. Reduce your daily sodium intake. Many people with hypertension should eat less than  1,500 mg of sodium a day. Do not drink alcohol if: Your health care provider tells you not to drink. You are pregnant, may be pregnant, or are planning to become pregnant. If you drink alcohol: Limit how much you have to: 0-1 drink a day for women. 0-2 drinks a day for men. Know how much alcohol is in your drink. In the [place] one drink equals one 12 oz bottle of beer (355 mL), one 5 oz glass of wine (148 mL), or one 1 oz glass of hard liquor (44 mL). Lifestyle  Work with your health care provider to maintain a healthy body weight or to lose weight. Ask what an ideal weight is for you. Get at least 30 minutes of exercise that causes your heart to beat faster (aerobic exercise) most days of the week. Activities may include walking, swimming, or biking. Include exercise to strengthen your muscles (resistance exercise), such as Pilates or lifting weights, as part of your weekly exercise routine. Try to do these types of exercises for 30 minutes at least 3 days a week. Do not use any products that contain nicotine or tobacco. These products include cigarettes, chewing tobacco, and vaping devices, such as e-cigarettes. If you need help quitting, ask your health care provider. Monitor your blood pressure at home as told by your health care provider. Keep all follow-up visits. This is important. Medicines Take over-the-counter and prescription medicines only as told by your health care provider. Follow directions carefully. Blood pressure medicines must be taken  as prescribed. Do not skip doses of blood pressure medicine. Doing this puts you at risk for problems and can make the medicine less effective. Ask your health care provider about side effects or reactions to medicines that you should watch for. Contact a health care provider if you: Think you are having a reaction to a medicine you are taking. Have headaches that keep coming back (recurring). Feel dizzy. Have swelling in your ankles. Have trouble with your vision. Get help right away if you: Develop a severe headache or confusion. Have unusual weakness or numbness. Feel faint. Have severe pain in your chest or abdomen. Vomit repeatedly. Have trouble breathing. These symptoms may be an emergency. Get help right away. Call 911. Do not wait to see if the symptoms will go away. Do not drive yourself to the hospital. Summary Hypertension is when the force of blood pumping through your arteries is too strong. If this condition is not controlled, it may put you at risk for serious complications. Your personal target blood pressure may vary depending on your medical conditions, your age, and other factors. For most people, a normal blood pressure is less than 120/80. Hypertension is treated with lifestyle changes, medicines, or a combination of both. Lifestyle changes include losing weight, eating a healthy, low-sodium diet, exercising more, and limiting alcohol. This information is not intended to replace advice given to you by your health care provider. Make sure you discuss any questions you have with your health care provider. Document Revised: 10/08/2021 Document Reviewed: 10/08/2021 Elsevier Patient Education  2024 [employer] Inc.    Edwina Barth, MD [facility]

## 2023-11-09 NOTE — Assessment & Plan Note (Signed)
Smoking less.  Cardiovascular and cancer risks associated with smoking discussed.  Smoking cessation advice given.

## 2023-11-09 NOTE — Assessment & Plan Note (Signed)
BP Readings from Last 3 Encounters:  11/09/23 130/78  08/05/23 (!) 190/96  05/18/23 (!) 162/102  Well-controlled hypertension Continue valsartan 160 mg and amlodipine 5 mg daily. Cardiovascular risks associated with hypertension discussed Diet and nutrition discussed

## 2023-11-09 NOTE — Patient Instructions (Signed)
Hypertension, Adult High blood pressure (hypertension) is when the force of blood pumping through the arteries is too strong. The arteries are the blood vessels that carry blood from the heart throughout the body. Hypertension forces the heart to work harder to pump blood and may cause arteries to become narrow or stiff. Untreated or uncontrolled hypertension can lead to a heart attack, heart failure, a stroke, kidney disease, and other problems. A blood pressure reading consists of a higher number over a lower number. Ideally, your blood pressure should be below 120/80. The first ("top") number is called the systolic pressure. It is a measure of the pressure in your arteries as your heart beats. The second ("bottom") number is called the diastolic pressure. It is a measure of the pressure in your arteries as the heart relaxes. What are the causes? The exact cause of this condition is not known. There are some conditions that result in high blood pressure. What increases the risk? Certain factors may make you more likely to develop high blood pressure. Some of these risk factors are under your control, including: Smoking. Not getting enough exercise or physical activity. Being overweight. Having too much fat, sugar, calories, or salt (sodium) in your diet. Drinking too much alcohol. Other risk factors include: Having a personal history of heart disease, diabetes, high cholesterol, or kidney disease. Stress. Having a family history of high blood pressure and high cholesterol. Having obstructive sleep apnea. Age. The risk increases with age. What are the signs or symptoms? High blood pressure may not cause symptoms. Very high blood pressure (hypertensive crisis) may cause: Headache. Fast or irregular heartbeats (palpitations). Shortness of breath. Nosebleed. Nausea and vomiting. Vision changes. Severe chest pain, dizziness, and seizures. How is this diagnosed? This condition is diagnosed by  measuring your blood pressure while you are seated, with your arm resting on a flat surface, your legs uncrossed, and your feet flat on the floor. The cuff of the blood pressure monitor will be placed directly against the skin of your upper arm at the level of your heart. Blood pressure should be measured at least twice using the same arm. Certain conditions can cause a difference in blood pressure between your right and left arms. If you have a high blood pressure reading during one visit or you have normal blood pressure with other risk factors, you may be asked to: Return on a different day to have your blood pressure checked again. Monitor your blood pressure at home for 1 week or longer. If you are diagnosed with hypertension, you may have other blood or imaging tests to help your health care provider understand your overall risk for other conditions. How is this treated? This condition is treated by making healthy lifestyle changes, such as eating healthy foods, exercising more, and reducing your alcohol intake. You may be referred for counseling on a healthy diet and physical activity. Your health care provider may prescribe medicine if lifestyle changes are not enough to get your blood pressure under control and if: Your systolic blood pressure is above 130. Your diastolic blood pressure is above 80. Your personal target blood pressure may vary depending on your medical conditions, your age, and other factors. Follow these instructions at home: Eating and drinking  Eat a diet that is high in fiber and potassium, and low in sodium, added sugar, and fat. An example of this eating plan is called the DASH diet. DASH stands for Dietary Approaches to Stop Hypertension. To eat this way: Eat   plenty of fresh fruits and vegetables. Try to fill one half of your plate at each meal with fruits and vegetables. Eat whole grains, such as whole-wheat pasta, brown rice, or whole-grain bread. Fill about one  fourth of your plate with whole grains. Eat or drink low-fat dairy products, such as skim milk or low-fat yogurt. Avoid fatty cuts of meat, processed or cured meats, and poultry with skin. Fill about one fourth of your plate with lean proteins, such as fish, chicken without skin, beans, eggs, or tofu. Avoid pre-made and processed foods. These tend to be higher in sodium, added sugar, and fat. Reduce your daily sodium intake. Many people with hypertension should eat less than 1,500 mg of sodium a day. Do not drink alcohol if: Your health care provider tells you not to drink. You are pregnant, may be pregnant, or are planning to become pregnant. If you drink alcohol: Limit how much you have to: 0-1 drink a day for women. 0-2 drinks a day for men. Know how much alcohol is in your drink. In the [place] one drink equals one 12 oz bottle of beer (355 mL), one 5 oz glass of wine (148 mL), or one 1 oz glass of hard liquor (44 mL). Lifestyle  Work with your health care provider to maintain a healthy body weight or to lose weight. Ask what an ideal weight is for you. Get at least 30 minutes of exercise that causes your heart to beat faster (aerobic exercise) most days of the week. Activities may include walking, swimming, or biking. Include exercise to strengthen your muscles (resistance exercise), such as Pilates or lifting weights, as part of your weekly exercise routine. Try to do these types of exercises for 30 minutes at least 3 days a week. Do not use any products that contain nicotine or tobacco. These products include cigarettes, chewing tobacco, and vaping devices, such as e-cigarettes. If you need help quitting, ask your health care provider. Monitor your blood pressure at home as told by your health care provider. Keep all follow-up visits. This is important. Medicines Take over-the-counter and prescription medicines only as told by your health care provider. Follow directions carefully. Blood  pressure medicines must be taken as prescribed. Do not skip doses of blood pressure medicine. Doing this puts you at risk for problems and can make the medicine less effective. Ask your health care provider about side effects or reactions to medicines that you should watch for. Contact a health care provider if you: Think you are having a reaction to a medicine you are taking. Have headaches that keep coming back (recurring). Feel dizzy. Have swelling in your ankles. Have trouble with your vision. Get help right away if you: Develop a severe headache or confusion. Have unusual weakness or numbness. Feel faint. Have severe pain in your chest or abdomen. Vomit repeatedly. Have trouble breathing. These symptoms may be an emergency. Get help right away. Call 911. Do not wait to see if the symptoms will go away. Do not drive yourself to the hospital. Summary Hypertension is when the force of blood pumping through your arteries is too strong. If this condition is not controlled, it may put you at risk for serious complications. Your personal target blood pressure may vary depending on your medical conditions, your age, and other factors. For most people, a normal blood pressure is less than 120/80. Hypertension is treated with lifestyle changes, medicines, or a combination of both. Lifestyle changes include losing weight, eating a healthy,   low-sodium diet, exercising more, and limiting alcohol. This information is not intended to replace advice given to you by your health care provider. Make sure you discuss any questions you have with your health care provider. Document Revised: 10/08/2021 Document Reviewed: 10/08/2021 Elsevier Patient Education  2024 [employer].  

## 2023-12-18 ENCOUNTER — Other Ambulatory Visit: Payer: Self-pay | Admitting: Family Medicine

## 2023-12-18 ENCOUNTER — Ambulatory Visit
Admission: RE | Admit: 2023-12-18 | Discharge: 2023-12-18 | Disposition: A | Discharge status: Home / Self Care | Payer: No Typology Code available for payment source | Source: Ambulatory Visit | Attending: Family Medicine | Admitting: Family Medicine

## 2023-12-18 DIAGNOSIS — M898X1 Other specified disorders of bone, shoulder: Secondary | ICD-10-CM

## 2024-05-10 ENCOUNTER — Ambulatory Visit: Payer: 59 | Admitting: Emergency Medicine

## 2024-06-08 ENCOUNTER — Encounter: Payer: Self-pay | Admitting: Emergency Medicine

## 2024-06-08 ENCOUNTER — Ambulatory Visit: Admitting: Emergency Medicine

## 2024-06-08 VITALS — BP 160/90 | HR 74 | Temp 98.0°F | Ht 72.0 in | Wt 184.0 lb

## 2024-06-08 DIAGNOSIS — F17200 Nicotine dependence, unspecified, uncomplicated: Secondary | ICD-10-CM | POA: Diagnosis not present

## 2024-06-08 DIAGNOSIS — R1032 Left lower quadrant pain: Secondary | ICD-10-CM | POA: Insufficient documentation

## 2024-06-08 DIAGNOSIS — E785 Hyperlipidemia, unspecified: Secondary | ICD-10-CM

## 2024-06-08 DIAGNOSIS — I10 Essential (primary) hypertension: Secondary | ICD-10-CM

## 2024-06-08 DIAGNOSIS — Z1211 Encounter for screening for malignant neoplasm of colon: Secondary | ICD-10-CM

## 2024-06-08 MED ORDER — AMLODIPINE BESYLATE 5 MG PO TABS: 5.0000 mg | ORAL_TABLET | Freq: Every day | ORAL | 3 refills | Status: DC

## 2024-06-08 NOTE — Assessment & Plan Note (Signed)
 Clinically stable.  No red flag signs or symptoms. Benign abdominal examination Differential diagnosis discussed. Diet and nutrition discussed Needs colonoscopy.  Referral placed today. ED precautions given

## 2024-06-08 NOTE — Patient Instructions (Signed)
 Abdominal Pain, Adult    Many things can cause belly (abdominal) pain. In most cases, belly pain is not a serious problem and can be watched and treated at home. But in some cases, it can be serious.  Your doctor will try to find the cause of your belly pain.  Follow these instructions at home:  Medicines  Take over-the-counter and prescription medicines only as told by your doctor.  Do not take medicines that help you poop (laxatives) unless told by your doctor.  General instructions  Watch your belly pain for any changes. Tell your doctor if the pain gets worse.  Drink enough fluid to keep your pee (urine) pale yellow.  Contact a doctor if:  Your belly pain changes or gets worse.  You have very bad cramping or bloating in your belly.  You vomit.  Your pain gets worse with meals, after eating, or with certain foods.  You have trouble pooping or have watery poop for more than 2-3 days.  You are not hungry, or you lose weight without trying.  You have signs of not getting enough fluid or water (dehydration). These may include:  Dark pee, very Chastain pee, or no pee.  Cracked lips or dry mouth.  Feeling sleepy or weak.  You have pain when you pee or poop.  Your belly pain wakes you up at night.  You have blood in your pee.  You have a fever.  Get help right away if:  You cannot stop vomiting.  Your pain is only in one part of your belly, like on the right side.  You have bloody or black poop, or poop that looks like tar.  You have trouble breathing.  You have chest pain.  These symptoms may be an emergency. Get help right away. Call 911.  Do not wait to see if the symptoms will go away.  Do not drive yourself to the hospital.  This information is not intended to replace advice given to you by your health care provider. Make sure you discuss any questions you have with your health care provider.  Document Revised: 09/17/2022 Document Reviewed: 09/17/2022  Elsevier Patient Education  2024 [employer].

## 2024-06-08 NOTE — Assessment & Plan Note (Signed)
 Smoking less.  Cardiovascular and cancer risks associated with smoking discussed.  Smoking cessation advice given.

## 2024-06-08 NOTE — Progress Notes (Signed)
 Travis Trevino 68 y.o.   Chief Complaint  Patient presents with   GI Problem    Patient states he's here for intese feeling of water rushing through him. He only noticed it when he is walking, also mentions he started juicing and these symptoms started around the same time. There is no pain, no bloating. He also mentions locking himself out of his apartment and hit head on a corner and did cause bleeding.     HISTORY OF PRESENT ILLNESS: This is a 68 y.o. male A1A complaining of left lower abdomen occasional discomfort Started juicing wound symptoms appeared No other associated symptoms.  Denies nausea or vomiting, rectal bleeding, melena, urinary symptoms, fever or chills. Needs referral for colonoscopy No other complaints or medical concerns today.  GI Problem The primary symptoms include abdominal pain. Primary symptoms do not include fever, nausea, vomiting, diarrhea, melena, dysuria or rash.  The illness does not include chills or constipation.     Prior to Admission medications   Medication Sig Start Date End Date Taking? Authorizing Provider  amLODipine  (NORVASC ) 5 MG tablet Take 1 tablet (5 mg total) by mouth daily. 11/09/23 02/07/24  Purcell Emil Schanz, MD  rosuvastatin  (CRESTOR ) 20 MG tablet Take 1 tablet (20 mg total) by mouth daily. 11/09/23   Purcell Emil Schanz, MD  valsartan  (DIOVAN ) 160 MG tablet Take 1 tablet (160 mg total) by mouth daily. 11/09/23   Purcell Emil Schanz, MD    No Known Allergies  Patient Active Problem List   Diagnosis Date Noted   Current smoker 08/05/2023   History of bilateral hip replacements 05/18/2023   Prediabetes 12/22/2019   Dyslipidemia 12/22/2019   Essential hypertension 06/30/2018   RLS (restless legs syndrome) 11/15/2016    Past Medical History:  Diagnosis Date   Arthritis    Heart murmur     Past Surgical History:  Procedure Laterality Date   KNEE ARTHROSCOPY W/ MENISCAL REPAIR Left    06/2016    Social  History   Socioeconomic History   Marital status: Married    Spouse name: Not on file   Number of children: Not on file   Years of education: Not on file   Highest education level: Not on file  Occupational History   Not on file  Tobacco Use   Smoking status: Some Days   Smokeless tobacco: Never  Substance and Sexual Activity   Alcohol use: Yes    Comment: social   Drug use: Never   Sexual activity: Yes  Other Topics Concern   Not on file  Social History Narrative   Not on file   Social Drivers of Health   Financial Resource Strain: Not on file  Food Insecurity: Not on file  Transportation Needs: Not on file  Physical Activity: Not on file  Stress: Not on file  Social Connections: Not on file  Intimate Partner Violence: Not on file    Family History  Problem Relation Age of Onset   Cancer Maternal Aunt      Review of Systems  Constitutional: Negative.  Negative for chills and fever.  HENT: Negative.  Negative for congestion and sore throat.   Respiratory: Negative.  Negative for cough and shortness of breath.   Cardiovascular: Negative.  Negative for chest pain and palpitations.  Gastrointestinal:  Positive for abdominal pain. Negative for blood in stool, constipation, diarrhea, melena, nausea and vomiting.  Genitourinary: Negative.  Negative for dysuria and hematuria.  Musculoskeletal: Negative.   Skin: Negative.  Negative for rash.  Neurological: Negative.  Negative for dizziness and headaches.  All other systems reviewed and are negative.   Today's Vitals   06/08/24 1018  BP: (!) 160/90  Pulse: 74  Temp: 98 F (36.7 C)  TempSrc: Oral  SpO2: 98%  Weight: 184 lb (83.5 kg)  Height: 6' (1.829 m)   Body mass index is 24.95 kg/m.   Physical Exam Vitals reviewed.  Constitutional:      Appearance: Normal appearance.  HENT:     Head: Normocephalic.     Mouth/Throat:     Mouth: Mucous membranes are moist.     Pharynx: Oropharynx is clear.   Eyes:      Extraocular Movements: Extraocular movements intact.     Pupils: Pupils are equal, round, and reactive to light.    Cardiovascular:     Rate and Rhythm: Normal rate and regular rhythm.     Pulses: Normal pulses.     Heart sounds: Normal heart sounds.  Pulmonary:     Effort: Pulmonary effort is normal.     Breath sounds: Normal breath sounds.  Abdominal:     Palpations: Abdomen is soft.     Tenderness: There is no abdominal tenderness.   Musculoskeletal:     Cervical back: No tenderness.  Lymphadenopathy:     Cervical: No cervical adenopathy.   Skin:    General: Skin is warm and dry.     Capillary Refill: Capillary refill takes less than 2 seconds.   Neurological:     General: No focal deficit present.     Mental Status: He is alert and oriented to person, place, and time.   Psychiatric:        Mood and Affect: Mood normal.        Behavior: Behavior normal.      ASSESSMENT & PLAN: A total of 40 minutes was spent with the patient and counseling/coordination of care regarding preparing for this visit, review of most recent office visit notes, review of multiple chronic medical conditions and their management, differential diagnosis of lower abdominal pain and need for colonoscopy, review of all medications, review of most recent bloodwork results, review of health maintenance items, education on nutrition, prognosis, documentation, and need for follow up.   Problem List Items Addressed This Visit       Cardiovascular and Mediastinum   Essential hypertension   BP Readings from Last 3 Encounters:  06/08/24 (!) 160/90  11/09/23 130/78  08/05/23 (!) 190/96  Elevated blood pressure in the office today Advised to monitor blood pressure readings at home daily for the next several weeks and contact the office if numbers persistently abnormal Needs to schedule appointment for physical Continue valsartan  160 mg and amlodipine  5 mg daily. Cardiovascular risks associated  with hypertension discussed Diet and nutrition discussed       Relevant Medications   amLODipine  (NORVASC ) 5 MG tablet     Other   Dyslipidemia   Diet and nutrition discussed Recommend rosuvastatin  20 mg daily      Current smoker   Smoking less. Cardiovascular and cancer risks associated with smoking discussed Smoking cessation advice given      Abdominal discomfort in left lower quadrant - Primary   Clinically stable.  No red flag signs or symptoms. Benign abdominal examination Differential diagnosis discussed. Diet and nutrition discussed Needs colonoscopy.  Referral placed today. ED precautions given      Other Visit Diagnoses       Screening for colon  cancer       Relevant Orders   Ambulatory referral to Gastroenterology      Patient Instructions  Abdominal Pain, Adult  Many things can cause belly (abdominal) pain. In most cases, belly pain is not a serious problem and can be watched and treated at home. But in some cases, it can be serious. Your doctor will try to find the cause of your belly pain. Follow these instructions at home: Medicines Take over-the-counter and prescription medicines only as told by your doctor. Do not take medicines that help you poop (laxatives) unless told by your doctor. General instructions Watch your belly pain for any changes. Tell your doctor if the pain gets worse. Drink enough fluid to keep your pee (urine) pale yellow. Contact a doctor if: Your belly pain changes or gets worse. You have very bad cramping or bloating in your belly. You vomit. Your pain gets worse with meals, after eating, or with certain foods. You have trouble pooping or have watery poop for more than 2-3 days. You are not hungry, or you lose weight without trying. You have signs of not getting enough fluid or water (dehydration). These may include: Dark pee, very little pee, or no pee. Cracked lips or dry mouth. Feeling sleepy or weak. You have pain  when you pee or poop. Your belly pain wakes you up at night. You have blood in your pee. You have a fever. Get help right away if: You cannot stop vomiting. Your pain is only in one part of your belly, like on the right side. You have bloody or black poop, or poop that looks like tar. You have trouble breathing. You have chest pain. These symptoms may be an emergency. Get help right away. Call 911. Do not wait to see if the symptoms will go away. Do not drive yourself to the hospital. This information is not intended to replace advice given to you by your health care provider. Make sure you discuss any questions you have with your health care provider. Document Revised: 09/17/2022 Document Reviewed: 09/17/2022 Elsevier Patient Education  2024 Elsevier Inc.    Emil Schaumann, MD [facility]

## 2024-06-08 NOTE — Assessment & Plan Note (Signed)
 BP Readings from Last 3 Encounters:  06/08/24 (!) 160/90  11/09/23 130/78  08/05/23 (!) 190/96  Elevated blood pressure in the office today Advised to monitor blood pressure readings at home daily for the next several weeks and contact the office if numbers persistently abnormal Needs to schedule appointment for physical Continue valsartan  160 mg and amlodipine  5 mg daily. Cardiovascular risks associated with hypertension discussed Diet and nutrition discussed

## 2024-06-08 NOTE — Assessment & Plan Note (Signed)
 Diet and nutrition discussed Recommend rosuvastatin 20 mg daily.

## 2024-08-08 ENCOUNTER — Ambulatory Visit: Admitting: Emergency Medicine

## 2024-08-25 ENCOUNTER — Encounter: Payer: Self-pay | Admitting: Emergency Medicine

## 2024-09-02 ENCOUNTER — Encounter: Payer: Self-pay | Admitting: Gastroenterology

## 2024-09-08 ENCOUNTER — Ambulatory Visit: Admitting: Emergency Medicine

## 2024-09-08 ENCOUNTER — Ambulatory Visit: Payer: Self-pay | Admitting: Emergency Medicine

## 2024-09-08 ENCOUNTER — Encounter: Payer: Self-pay | Admitting: Emergency Medicine

## 2024-09-08 VITALS — BP 140/100 | HR 69 | Temp 98.1°F | Ht 72.0 in | Wt 189.0 lb

## 2024-09-08 DIAGNOSIS — Z125 Encounter for screening for malignant neoplasm of prostate: Secondary | ICD-10-CM | POA: Diagnosis not present

## 2024-09-08 DIAGNOSIS — F17200 Nicotine dependence, unspecified, uncomplicated: Secondary | ICD-10-CM

## 2024-09-08 DIAGNOSIS — E785 Hyperlipidemia, unspecified: Secondary | ICD-10-CM

## 2024-09-08 DIAGNOSIS — R7303 Prediabetes: Secondary | ICD-10-CM

## 2024-09-08 DIAGNOSIS — I10 Essential (primary) hypertension: Secondary | ICD-10-CM | POA: Diagnosis not present

## 2024-09-08 DIAGNOSIS — Z130 Encounter for screening for diseases of the blood and blood-forming organs and certain disorders involving the immune mechanism: Secondary | ICD-10-CM | POA: Diagnosis not present

## 2024-09-08 DIAGNOSIS — Z0000 Encounter for general adult medical examination without abnormal findings: Secondary | ICD-10-CM

## 2024-09-08 DIAGNOSIS — Z0001 Encounter for general adult medical examination with abnormal findings: Secondary | ICD-10-CM

## 2024-09-08 DIAGNOSIS — Z9189 Other specified personal risk factors, not elsewhere classified: Secondary | ICD-10-CM

## 2024-09-08 DIAGNOSIS — Z13228 Encounter for screening for other metabolic disorders: Secondary | ICD-10-CM

## 2024-09-08 DIAGNOSIS — Z1329 Encounter for screening for other suspected endocrine disorder: Secondary | ICD-10-CM | POA: Diagnosis not present

## 2024-09-08 LAB — COMPREHENSIVE METABOLIC PANEL WITH GFR
ALT: 20 U/L (ref 0–53)
AST: 19 U/L (ref 0–37)
Albumin: 4.1 g/dL (ref 3.5–5.2)
Alkaline Phosphatase: 50 U/L (ref 39–117)
BUN: 16 mg/dL (ref 6–23)
CO2: 25 meq/L (ref 19–32)
Calcium: 9.7 mg/dL (ref 8.4–10.5)
Chloride: 105 meq/L (ref 96–112)
Creatinine, Ser: 1.34 mg/dL (ref 0.40–1.50)
GFR: 54.33 mL/min — ABNORMAL LOW (ref >60.00)
Glucose, Bld: 81 mg/dL (ref 70–99)
Potassium: 4.4 meq/L (ref 3.5–5.1)
Sodium: 138 meq/L (ref 135–145)
Total Bilirubin: 0.8 mg/dL (ref 0.2–1.2)
Total Protein: 7.1 g/dL (ref 6.0–8.3)

## 2024-09-08 LAB — HEMOGLOBIN A1C: Hgb A1c MFr Bld: 6.5 % (ref 4.6–6.5)

## 2024-09-08 LAB — CBC WITH DIFFERENTIAL/PLATELET
Basophils Absolute: 0 K/uL (ref 0.0–0.1)
Basophils Relative: 0.5 % (ref 0.0–3.0)
Eosinophils Absolute: 0.3 K/uL (ref 0.0–0.7)
Eosinophils Relative: 3.1 % (ref 0.0–5.0)
HCT: 43.2 % (ref 39.0–52.0)
Hemoglobin: 14.1 g/dL (ref 13.0–17.0)
Lymphocytes Relative: 20.4 % (ref 12.0–46.0)
Lymphs Abs: 1.7 K/uL (ref 0.7–4.0)
MCHC: 32.7 g/dL (ref 30.0–36.0)
MCV: 92.8 fl (ref 78.0–100.0)
Monocytes Absolute: 1 K/uL (ref 0.1–1.0)
Monocytes Relative: 12.5 % — ABNORMAL HIGH (ref 3.0–12.0)
Neutro Abs: 5.3 K/uL (ref 1.4–7.7)
Neutrophils Relative %: 63.5 % (ref 43.0–77.0)
Platelets: 346 K/uL (ref 150.0–400.0)
RBC: 4.66 Mil/uL (ref 4.22–5.81)
RDW: 13.8 % (ref 11.5–15.5)
WBC: 8.3 K/uL (ref 4.0–10.5)

## 2024-09-08 LAB — LIPID PANEL
Cholesterol: 153 mg/dL (ref 0–200)
HDL: 43.4 mg/dL (ref >39.00)
LDL Cholesterol: 87 mg/dL (ref 0–99)
NonHDL: 110.04
Total CHOL/HDL Ratio: 4
Triglycerides: 114 mg/dL (ref 0.0–149.0)
VLDL: 22.8 mg/dL (ref 0.0–40.0)

## 2024-09-08 LAB — PSA: PSA: 3.29 ng/mL (ref 0.10–4.00)

## 2024-09-08 MED ORDER — VALSARTAN 160 MG PO TABS: 160.0000 mg | ORAL_TABLET | Freq: Every day | ORAL | 3 refills | Status: AC

## 2024-09-08 MED ORDER — AMLODIPINE BESYLATE 5 MG PO TABS: 5.0000 mg | ORAL_TABLET | Freq: Every day | ORAL | 3 refills | Status: AC

## 2024-09-08 MED ORDER — ROSUVASTATIN CALCIUM 20 MG PO TABS: 20.0000 mg | ORAL_TABLET | Freq: Every day | ORAL | 3 refills | Status: AC

## 2024-09-08 NOTE — Assessment & Plan Note (Signed)
 Diet and nutrition discussed Cardiovascular risks associated with diabetes discussed Hemoglobin A1c done today

## 2024-09-08 NOTE — Progress Notes (Signed)
 Travis Trevino 68 y.o.   Chief Complaint  Patient presents with   Annual Exam    HISTORY OF PRESENT ILLNESS: This is a 68 y.o. male here for annual exam and follow-up of multiple chronic medical conditions including hypertension and dyslipidemia. Overall doing well.  Smoking a lot less. Scheduled for colonoscopy in a couple weeks Has no complaints or medical concerns today.  HPI   Prior to Admission medications   Medication Sig Start Date End Date Taking? Authorizing Provider  amLODipine  (NORVASC ) 5 MG tablet Take 1 tablet (5 mg total) by mouth daily. 06/08/24 09/08/24 Yes Nissim Fleischer, Emil Schanz, MD  rosuvastatin  (CRESTOR ) 20 MG tablet Take 1 tablet (20 mg total) by mouth daily. 11/09/23  Yes Brookelynn Hamor, Emil Schanz, MD  valsartan  (DIOVAN ) 160 MG tablet Take 1 tablet (160 mg total) by mouth daily. 11/09/23  Yes Purcell Emil Schanz, MD    No Known Allergies  Patient Active Problem List   Diagnosis Date Noted   Current smoker 08/05/2023   History of bilateral hip replacements 05/18/2023   Prediabetes 12/22/2019   Dyslipidemia 12/22/2019   Essential hypertension 06/30/2018   RLS (restless legs syndrome) 11/15/2016    Past Medical History:  Diagnosis Date   Arthritis    Heart murmur     Past Surgical History:  Procedure Laterality Date   KNEE ARTHROSCOPY W/ MENISCAL REPAIR Left    06/2016    Social History   Socioeconomic History   Marital status: Married    Spouse name: Not on file   Number of children: Not on file   Years of education: Not on file   Highest education level: Not on file  Occupational History   Not on file  Tobacco Use   Smoking status: Some Days   Smokeless tobacco: Never  Substance and Sexual Activity   Alcohol use: Yes    Comment: social   Drug use: Never   Sexual activity: Yes  Other Topics Concern   Not on file  Social History Narrative   Not on file   Social Drivers of Health   Financial Resource Strain: Not on file  Food  Insecurity: Not on file  Transportation Needs: Not on file  Physical Activity: Not on file  Stress: Not on file  Social Connections: Not on file  Intimate Partner Violence: Not on file    Family History  Problem Relation Age of Onset   Cancer Maternal Aunt      Review of Systems  Constitutional: Negative.  Negative for chills and fever.  HENT: Negative.  Negative for congestion and sore throat.   Respiratory: Negative.  Negative for cough and shortness of breath.   Cardiovascular: Negative.  Negative for chest pain and palpitations.  Gastrointestinal:  Negative for abdominal pain, diarrhea, nausea and vomiting.  Genitourinary: Negative.  Negative for dysuria and hematuria.  Skin: Negative.  Negative for rash.  Neurological: Negative.  Negative for dizziness and headaches.  All other systems reviewed and are negative.   Vitals:   09/08/24 0951 09/08/24 0956  BP: (!) 140/100 (!) 140/100  Pulse: 69   Temp: 98.1 F (36.7 C)   SpO2: 99%     Physical Exam Vitals reviewed.  Constitutional:      Appearance: Normal appearance.  HENT:     Head: Normocephalic.     Right Ear: Tympanic membrane, ear canal and external ear normal.     Left Ear: Tympanic membrane, ear canal and external ear normal.  Mouth/Throat:     Mouth: Mucous membranes are moist.     Pharynx: Oropharynx is clear.  Eyes:     Extraocular Movements: Extraocular movements intact.     Conjunctiva/sclera: Conjunctivae normal.     Pupils: Pupils are equal, round, and reactive to light.  Cardiovascular:     Rate and Rhythm: Normal rate and regular rhythm.     Pulses: Normal pulses.     Heart sounds: Normal heart sounds.  Pulmonary:     Effort: Pulmonary effort is normal.     Breath sounds: Normal breath sounds.  Abdominal:     Palpations: Abdomen is soft.     Tenderness: There is no abdominal tenderness.  Musculoskeletal:     Cervical back: No tenderness.  Lymphadenopathy:     Cervical: No cervical  adenopathy.  Skin:    General: Skin is warm and dry.     Capillary Refill: Capillary refill takes less than 2 seconds.  Neurological:     General: No focal deficit present.     Mental Status: He is alert and oriented to person, place, and time.  Psychiatric:        Mood and Affect: Mood normal.        Behavior: Behavior normal.      ASSESSMENT & PLAN: Problem List Items Addressed This Visit       Cardiovascular and Mediastinum   Essential hypertension   BP Readings from Last 3 Encounters:  09/08/24 (!) 140/100  06/08/24 (!) 160/90  11/09/23 130/78  Elevated blood pressure in the office Advised to monitor blood pressure readings at home daily for the next couple of weeks and contact the office if numbers persistently abnormal Cardiovascular risks associated with uncontrolled hypertension discussed Continue amlodipine  5 mg daily along with valsartan  160 mg daily       Relevant Medications   amLODipine  (NORVASC ) 5 MG tablet   rosuvastatin  (CRESTOR ) 20 MG tablet   valsartan  (DIOVAN ) 160 MG tablet   Other Relevant Orders   CBC with Differential/Platelet   Comprehensive metabolic panel with GFR   Hemoglobin A1c   Lipid panel   PSA     Other   Prediabetes   Diet and nutrition discussed Cardiovascular risks associated with diabetes discussed Hemoglobin A1c done today      Relevant Orders   Hemoglobin A1c   Dyslipidemia   Diet and nutrition discussed Recommend rosuvastatin  20 mg daily      Relevant Medications   rosuvastatin  (CRESTOR ) 20 MG tablet   Other Relevant Orders   Lipid panel   Current smoker   Smoking less. Cardiovascular and cancer risks associated with smoking discussed Smoking cessation advice given      Relevant Orders   CBC with Differential/Platelet   Ambulatory Referral for Lung Cancer Scre   Other Visit Diagnoses       Encounter for general adult medical examination with abnormal findings    -  Primary   Relevant Orders   CBC with  Differential/Platelet   Comprehensive metabolic panel with GFR   Hemoglobin A1c   Lipid panel   PSA     Screening for prostate cancer       Relevant Orders   PSA     At increased risk for cardiovascular disease       Relevant Medications   rosuvastatin  (CRESTOR ) 20 MG tablet     Screening for deficiency anemia       Relevant Orders   CBC with Differential/Platelet  Screening for endocrine, metabolic and immunity disorder       Relevant Orders   Comprehensive metabolic panel with GFR        Modifiable risk factors discussed with patient. Anticipatory guidance according to age provided. The following topics were also discussed: Social Determinants of Health Smoking.  Smoking a lot less about 1 cigarette/day Diet and nutrition Benefits of exercise Cancer screening.  Scheduled for colonoscopy in 2 weeks.  Also recommend lung cancer screening.  Referral placed. Vaccinations review and recommendations Cardiovascular risk assessment Review of multiple chronic medical conditions under management Review of all medications Mental health including depression and anxiety Fall and accident prevention  Patient Instructions  Health Maintenance, Male Adopting a healthy lifestyle and getting preventive care are important in promoting health and wellness. Ask your health care provider about: The right schedule for you to have regular tests and exams. Things you can do on your own to prevent diseases and keep yourself healthy. What should I know about diet, weight, and exercise? Eat a healthy diet  Eat a diet that includes plenty of vegetables, fruits, low-fat dairy products, and lean protein. Do not eat a lot of foods that are high in solid fats, added sugars, or sodium. Maintain a healthy weight Body mass index (BMI) is a measurement that can be used to identify possible weight problems. It estimates body fat based on height and weight. Your health care provider can help determine  your BMI and help you achieve or maintain a healthy weight. Get regular exercise Get regular exercise. This is one of the most important things you can do for your health. Most adults should: Exercise for at least 150 minutes each week. The exercise should increase your heart rate and make you sweat (moderate-intensity exercise). Do strengthening exercises at least twice a week. This is in addition to the moderate-intensity exercise. Spend less time sitting. Even light physical activity can be beneficial. Watch cholesterol and blood lipids Have your blood tested for lipids and cholesterol at 69 years of age, then have this test every 5 years. You may need to have your cholesterol levels checked more often if: Your lipid or cholesterol levels are high. You are older than 69 years of age. You are at high risk for heart disease. What should I know about cancer screening? Many types of cancers can be detected early and may often be prevented. Depending on your health history and family history, you may need to have cancer screening at various ages. This may include screening for: Colorectal cancer. Prostate cancer. Skin cancer. Lung cancer. What should I know about heart disease, diabetes, and high blood pressure? Blood pressure and heart disease High blood pressure causes heart disease and increases the risk of stroke. This is more likely to develop in people who have high blood pressure readings or are overweight. Talk with your health care provider about your target blood pressure readings. Have your blood pressure checked: Every 3-5 years if you are 33-56 years of age. Every year if you are 42 years old or older. If you are between the ages of 14 and 71 and are a current or former smoker, ask your health care provider if you should have a one-time screening for abdominal aortic aneurysm (AAA). Diabetes Have regular diabetes screenings. This checks your fasting blood sugar level. Have the  screening done: Once every three years after age 79 if you are at a normal weight and have a low risk for diabetes. More often and  at a younger age if you are overweight or have a high risk for diabetes. What should I know about preventing infection? Hepatitis B If you have a higher risk for hepatitis B, you should be screened for this virus. Talk with your health care provider to find out if you are at risk for hepatitis B infection. Hepatitis C Blood testing is recommended for: Everyone born from [birth year] through 1966. Anyone with known risk factors for hepatitis C. Sexually transmitted infections (STIs) You should be screened each year for STIs, including gonorrhea and chlamydia, if: You are sexually active and are younger than 69 years of age. You are older than 69 years of age and your health care provider tells you that you are at risk for this type of infection. Your sexual activity has changed since you were last screened, and you are at increased risk for chlamydia or gonorrhea. Ask your health care provider if you are at risk. Ask your health care provider about whether you are at high risk for HIV. Your health care provider may recommend a prescription medicine to help prevent HIV infection. If you choose to take medicine to prevent HIV, you should first get tested for HIV. You should then be tested every 3 months for as long as you are taking the medicine. Follow these instructions at home: Alcohol use Do not drink alcohol if your health care provider tells you not to drink. If you drink alcohol: Limit how much you have to 0-2 drinks a day. Know how much alcohol is in your drink. In the [place] one drink equals one 12 oz bottle of beer (355 mL), one 5 oz glass of wine (148 mL), or one 1 oz glass of hard liquor (44 mL). Lifestyle Do not use any products that contain nicotine or tobacco. These products include cigarettes, chewing tobacco, and vaping devices, such as e-cigarettes. If you  need help quitting, ask your health care provider. Do not use street drugs. Do not share needles. Ask your health care provider for help if you need support or information about quitting drugs. General instructions Schedule regular health, dental, and eye exams. Stay current with your vaccines. Tell your health care provider if: You often feel depressed. You have ever been abused or do not feel safe at home. Summary Adopting a healthy lifestyle and getting preventive care are important in promoting health and wellness. Follow your health care provider's instructions about healthy diet, exercising, and getting tested or screened for diseases. Follow your health care provider's instructions on monitoring your cholesterol and blood pressure. This information is not intended to replace advice given to you by your health care provider. Make sure you discuss any questions you have with your health care provider. Document Revised: 04/22/2021 Document Reviewed: 04/22/2021 Elsevier Patient Education  2024 Elsevier Inc.      Emil Schaumann, MD [facility]

## 2024-09-08 NOTE — Assessment & Plan Note (Signed)
 BP Readings from Last 3 Encounters:  09/08/24 (!) 140/100  06/08/24 (!) 160/90  11/09/23 130/78  Elevated blood pressure in the office Advised to monitor blood pressure readings at home daily for the next couple of weeks and contact the office if numbers persistently abnormal Cardiovascular risks associated with uncontrolled hypertension discussed Continue amlodipine  5 mg daily along with valsartan  160 mg daily

## 2024-09-08 NOTE — Patient Instructions (Signed)
 Health Maintenance, Male  Adopting a healthy lifestyle and getting preventive care are important in promoting health and wellness. Ask your health care provider about:  The right schedule for you to have regular tests and exams.  Things you can do on your own to prevent diseases and keep yourself healthy.  What should I know about diet, weight, and exercise?  Eat a healthy diet    Eat a diet that includes plenty of vegetables, fruits, low-fat dairy products, and lean protein.  Do not eat a lot of foods that are high in solid fats, added sugars, or sodium.  Maintain a healthy weight  Body mass index (BMI) is a measurement that can be used to identify possible weight problems. It estimates body fat based on height and weight. Your health care provider can help determine your BMI and help you achieve or maintain a healthy weight.  Get regular exercise  Get regular exercise. This is one of the most important things you can do for your health. Most adults should:  Exercise for at least 150 minutes each week. The exercise should increase your heart rate and make you sweat (moderate-intensity exercise).  Do strengthening exercises at least twice a week. This is in addition to the moderate-intensity exercise.  Spend less time sitting. Even light physical activity can be beneficial.  Watch cholesterol and blood lipids  Have your blood tested for lipids and cholesterol at 69 years of age, then have this test every 5 years.  You may need to have your cholesterol levels checked more often if:  Your lipid or cholesterol levels are high.  You are older than 69 years of age.  You are at high risk for heart disease.  What should I know about cancer screening?  Many types of cancers can be detected early and may often be prevented. Depending on your health history and family history, you may need to have cancer screening at various ages. This may include screening for:  Colorectal cancer.  Prostate cancer.  Skin cancer.  Lung  cancer.  What should I know about heart disease, diabetes, and high blood pressure?  Blood pressure and heart disease  High blood pressure causes heart disease and increases the risk of stroke. This is more likely to develop in people who have high blood pressure readings or are overweight.  Talk with your health care provider about your target blood pressure readings.  Have your blood pressure checked:  Every 3-5 years if you are 24-52 years of age.  Every year if you are 3 years old or older.  If you are between the ages of 60 and 72 and are a current or former smoker, ask your health care provider if you should have a one-time screening for abdominal aortic aneurysm (AAA).  Diabetes  Have regular diabetes screenings. This checks your fasting blood sugar level. Have the screening done:  Once every three years after age 66 if you are at a normal weight and have a low risk for diabetes.  More often and at a younger age if you are overweight or have a high risk for diabetes.  What should I know about preventing infection?  Hepatitis B  If you have a higher risk for hepatitis B, you should be screened for this virus. Talk with your health care provider to find out if you are at risk for hepatitis B infection.  Hepatitis C  Blood testing is recommended for:  Everyone born from [birth year] through 1966.  Anyone  with known risk factors for hepatitis C.  Sexually transmitted infections (STIs)  You should be screened each year for STIs, including gonorrhea and chlamydia, if:  You are sexually active and are younger than 69 years of age.  You are older than 69 years of age and your health care provider tells you that you are at risk for this type of infection.  Your sexual activity has changed since you were last screened, and you are at increased risk for chlamydia or gonorrhea. Ask your health care provider if you are at risk.  Ask your health care provider about whether you are at high risk for HIV. Your health care provider  may recommend a prescription medicine to help prevent HIV infection. If you choose to take medicine to prevent HIV, you should first get tested for HIV. You should then be tested every 3 months for as long as you are taking the medicine.  Follow these instructions at home:  Alcohol use  Do not drink alcohol if your health care provider tells you not to drink.  If you drink alcohol:  Limit how much you have to 0-2 drinks a day.  Know how much alcohol is in your drink. In the [place] one drink equals one 12 oz bottle of beer (355 mL), one 5 oz glass of wine (148 mL), or one 1 oz glass of hard liquor (44 mL).  Lifestyle  Do not use any products that contain nicotine or tobacco. These products include cigarettes, chewing tobacco, and vaping devices, such as e-cigarettes. If you need help quitting, ask your health care provider.  Do not use street drugs.  Do not share needles.  Ask your health care provider for help if you need support or information about quitting drugs.  General instructions  Schedule regular health, dental, and eye exams.  Stay current with your vaccines.  Tell your health care provider if:  You often feel depressed.  You have ever been abused or do not feel safe at home.  Summary  Adopting a healthy lifestyle and getting preventive care are important in promoting health and wellness.  Follow your health care provider's instructions about healthy diet, exercising, and getting tested or screened for diseases.  Follow your health care provider's instructions on monitoring your cholesterol and blood pressure.  This information is not intended to replace advice given to you by your health care provider. Make sure you discuss any questions you have with your health care provider.  Document Revised: 04/22/2021 Document Reviewed: 04/22/2021  Elsevier Patient Education  2024 [employer].

## 2024-09-08 NOTE — Assessment & Plan Note (Signed)
 Diet and nutrition discussed Recommend rosuvastatin 20 mg daily.

## 2024-09-08 NOTE — Assessment & Plan Note (Signed)
 Smoking less.  Cardiovascular and cancer risks associated with smoking discussed.  Smoking cessation advice given.

## 2024-09-16 ENCOUNTER — Telehealth: Payer: Self-pay | Admitting: *Deleted

## 2024-09-16 ENCOUNTER — Other Ambulatory Visit: Payer: Self-pay | Admitting: *Deleted

## 2024-09-16 DIAGNOSIS — F17210 Nicotine dependence, cigarettes, uncomplicated: Secondary | ICD-10-CM

## 2024-09-16 DIAGNOSIS — Z122 Encounter for screening for malignant neoplasm of respiratory organs: Secondary | ICD-10-CM

## 2024-09-16 DIAGNOSIS — Z87891 Personal history of nicotine dependence: Secondary | ICD-10-CM

## 2024-09-16 NOTE — Telephone Encounter (Signed)
 Lung Cancer Screening Narrative/Criteria Questionnaire (Cigarette Smokers Only- No Cigars/Pipes/vapes)   Travis Trevino   SDMV:09/28/24 9:00- [place]                                          1956-08-07              LDCT: 10/03/24- 9:20 GI    68 y.o.   Phone: [phone]  Lung Screening Narrative (confirm age 50-77 yrs Medicare / 50-80 yrs Private pay insurance)   Insurance information:[employer]   Referring Provider:Sagardia   This screening involves an initial phone call with a team member from our program. It is called a shared decision making visit. The initial meeting is required by insurance and Medicare to make sure you understand the program. This appointment takes about 15-20 minutes to complete. The CT scan will completed at a separate date/time. This scan takes about 5-10 minutes to complete and you may eat and drink before and after the scan.  Criteria questions for Lung Cancer Screening:   Are you a current or former smoker? Current Age began smoking: 21   If you are a former smoker, what year did you quit smoking? (within 15 yrs)   To calculate your smoking history, I need an accurate estimate of how many packs of cigarettes you smoked per day and for how many years. (Not just the number of PPD you are now smoking)   Years smoking 46 x Packs per day 1 = Pack years 46   (at least 20 pack yrs)   (Make sure they understand that we need to know how much they have smoked in the past, not just the number of PPD they are smoking now)  Do you have a personal history of cancer?  No    Do you have a family history of cancer? Yes  (cancer type and and relative) Mother (?)  Are you coughing up blood?  No  Have you had unexplained weight loss of 15 lbs or more in the last 6 months? No  It looks like you meet all criteria.     Additional information: N/A

## 2024-09-28 ENCOUNTER — Encounter: Payer: Self-pay | Admitting: Adult Health

## 2024-09-28 ENCOUNTER — Ambulatory Visit (INDEPENDENT_AMBULATORY_CARE_PROVIDER_SITE_OTHER): Admitting: Adult Health

## 2024-09-28 DIAGNOSIS — F17210 Nicotine dependence, cigarettes, uncomplicated: Secondary | ICD-10-CM

## 2024-09-28 NOTE — Progress Notes (Signed)
  Virtual Visit via Telephone Note  I connected with Travis Trevino , 09/28/24 9:11 AM by a telemedicine application and verified that I am speaking with the correct person using two identifiers.  Location: Patient: home Provider: home   I discussed the limitations of evaluation and management by telemedicine and the availability of in person appointments. The patient expressed understanding and agreed to proceed.   Shared Decision Making Visit Lung Cancer Screening Program [phone])   Eligibility: 68 y.o. Pack Years Smoking History Calculation =46 pack years (# packs/per year x # years smoked) Recent History of coughing up blood  no Unexplained weight loss? no ( >Than 15 pounds within the last 6 months ) Prior History Lung / other cancer no (Diagnosis within the last 5 years already requiring surveillance chest CT Scans). Smoking Status Current Smoker   Visit Components: Discussion included one or more decision making aids. YES Discussion included risk/benefits of screening. YES Discussion included potential follow up diagnostic testing for abnormal scans. YES Discussion included meaning and risk of over diagnosis. YES Discussion included meaning and risk of False Positives. YES Discussion included meaning of total radiation exposure. YES  Counseling Included: Importance of adherence to annual lung cancer LDCT screening. YES Impact of comorbidities on ability to participate in the program. YES Ability and willingness to under diagnostic treatment. YES  Smoking Cessation Counseling: Current Smokers:  Discussed importance of smoking cessation. yes Information about tobacco cessation classes and interventions provided to patient. yes Patient provided with ticket for LDCT Scan. yes Symptomatic Patient. NO Diagnosis Code: Tobacco Use Z72.0 Asymptomatic Patient yes  Counseling - 4 minutes of smoking cessation counseling (CT Chest Lung Cancer Screening Low Dose W/O CM)  [id number]  Z12.2-Screening of respiratory organs Z87.891-Personal history of nicotine dependence   Travis Trevino 09/28/24

## 2024-09-28 NOTE — Patient Instructions (Signed)

## 2024-10-03 ENCOUNTER — Inpatient Hospital Stay
Admission: RE | Admit: 2024-10-03 | Discharge: 2024-10-03 | Disposition: A | Source: Ambulatory Visit | Attending: Acute Care | Admitting: Acute Care

## 2024-10-03 DIAGNOSIS — Z87891 Personal history of nicotine dependence: Secondary | ICD-10-CM

## 2024-10-03 DIAGNOSIS — Z122 Encounter for screening for malignant neoplasm of respiratory organs: Secondary | ICD-10-CM

## 2024-10-03 DIAGNOSIS — F17210 Nicotine dependence, cigarettes, uncomplicated: Secondary | ICD-10-CM

## 2024-10-07 ENCOUNTER — Telehealth: Payer: Self-pay

## 2024-10-07 ENCOUNTER — Other Ambulatory Visit: Payer: Self-pay

## 2024-10-07 DIAGNOSIS — Z122 Encounter for screening for malignant neoplasm of respiratory organs: Secondary | ICD-10-CM

## 2024-10-07 DIAGNOSIS — F17210 Nicotine dependence, cigarettes, uncomplicated: Secondary | ICD-10-CM

## 2024-10-07 DIAGNOSIS — Z87891 Personal history of nicotine dependence: Secondary | ICD-10-CM

## 2024-10-07 NOTE — Telephone Encounter (Signed)
-----   Message from Lauraine JULIANNA Lites sent at 10/06/2024  9:57 AM EDT ----- Regarding: Right renal lesion Dr. Purcell,  We follow Travis Trevino for his lung cancer screening. His scan was read as a LR 1, so we will order and manage his annual follow up scan due 09/2025. There was an incidental finding of a  mildly hyperdense 1.6 cm off the upper pole right kidney cannot be characterized as a simple cyst. Further evaluation with pre and post contrast MRI or CT should be considered. MRI is preferred in younger patients (due to lack of ionizing radiation) and for evaluating calcified lesion(s). If you are In agreement  clinically with the follow up imaging , please order and follow for  results . If you have any questions, please do not hesitate to reach out.  Thank you so much for referring to the screening program.

## 2024-10-07 NOTE — Telephone Encounter (Signed)
 I have called and spoken with the patient and reviewed recent lung CT results. He will complete an annual Lung CT again next year. Order placed. He is on statin therapy. Pt will follow up with Dr. Purcell on MRI and CT recommendations for possible kidney cyst. I have spoken with Dr. Lebron office and the message from [place], NP has been received. Per Annell she will send a message to the nurse for patient follow up. Results and plan faxed to PCP.       IMPRESSION: 1. Lung-RADS 1, negative. Continue annual screening with low-dose chest CT without contrast in 12 months. 2. Mildly hyperdense 1.6 cm off the upper pole right kidney cannot be characterized as a simple cyst. Further evaluation with pre and post contrast MRI or CT should be considered. MRI is preferred in younger patients (due to lack of ionizing radiation) and for evaluating calcified lesion(s). 3. Mild cylindrical bronchiectasis. 4. Partially imaged left renal stone or vascular calcification. 5. Left anterior descending coronary artery calcification. 6.  Emphysema (ICD10-J43.9).

## 2024-10-07 NOTE — Telephone Encounter (Signed)
 Copied from CRM [id number]. Topic: Clinical - Lab/Test Results >> Oct 07, 2024  8:53 AM Mercedes MATSU wrote: Reason for CRM: Pulmonary called wanting to make Dr. Purcell aware that the patient was alerted for having lesions. Test results will be faxed over today from [facility].

## 2024-10-27 ENCOUNTER — Encounter: Payer: Self-pay | Admitting: Gastroenterology

## 2024-10-27 ENCOUNTER — Ambulatory Visit: Admitting: Gastroenterology

## 2024-10-27 VITALS — BP 136/86 | HR 77 | Ht 72.0 in | Wt 190.5 lb

## 2024-10-27 DIAGNOSIS — R142 Eructation: Secondary | ICD-10-CM

## 2024-10-27 DIAGNOSIS — Z1211 Encounter for screening for malignant neoplasm of colon: Secondary | ICD-10-CM

## 2024-10-27 MED ORDER — NA SULFATE-K SULFATE-MG SULF 17.5-3.13-1.6 GM/177ML PO SOLN
1.0000 | Freq: Once | ORAL | 0 refills | Status: AC
Start: 2024-10-27 — End: 2024-10-27

## 2024-10-27 NOTE — Patient Instructions (Signed)

## 2024-10-27 NOTE — Progress Notes (Signed)
 [name] Consult Note:  History: SIRE POET 10/27/2024  Referring provider: Purcell Emil Schanz, MD  Reason for consult/chief complaint: Colon Cancer Screening (Patient is here to discuss getting a routine colonoscopy. This will be his first one. He is not sure if there is a family history.) and Belching (Patient has been having excessive belching over the past few months. He feels it may be from some of the spicy food my wife cooks.)   Subjective  Prior history:   No prior GI history  Discussed the use of AI scribe software for clinical note transcription with the patient, who gave verbal consent to proceed.  History of Present Illness Travis Trevino is a 68 year old male who presents for colon cancer screening and evaluation of upper digestive symptoms. He was referred by Dr. Zagaria for colon cancer screening.  Lower gastrointestinal symptoms - Sensation of a 'surge of water flushing through' the intestines lasted for a couple of weeks sometimes several months ago; symptom has resolved - Occasional fleeting right-sided abdominal discomfort, attributed to dietary habits, especially consumption of spicy foods - No blood in stools - No prior colonoscopy No prior colorectal cancer screening (colonoscopy or stool studies)  Possible lactose intolerance and associated digestive upset - Gas and diarrhea occur particularly after consuming ice cream and butter - Uses lactose-free milk to manage symptoms  Upper gastrointestinal symptoms for last few weeks - Bloating and belching associated with eating too quickly - No dysphagia, vomiting, changes in appetite, or weight loss  Tobacco use - History of smoking - Quit two days ago after attempting to quit for the past two to three months   ROS:  Review of Systems  Constitutional:  Negative for appetite change and unexpected weight change.  HENT:  Negative for mouth sores and voice change.   Eyes:   Negative for pain and redness.  Respiratory:  Negative for cough and shortness of breath.   Cardiovascular:  Negative for chest pain and palpitations.  Genitourinary:  Negative for dysuria and hematuria.  Musculoskeletal:  Negative for arthralgias and myalgias.  Skin:  Negative for pallor and rash.  Neurological:  Negative for weakness and headaches.  Hematological:  Negative for adenopathy.     Past Medical History: Past Medical History:  Diagnosis Date   Arthritis    Heart murmur      Past Surgical History: Past Surgical History:  Procedure Laterality Date   KNEE ARTHROSCOPY W/ MENISCAL REPAIR Left    06/2016     Family History: Family History  Problem Relation Age of Onset   Cancer Maternal Aunt     Social History: Social History   Socioeconomic History   Marital status: Married    Spouse name: Not on file   Number of children: Not on file   Years of education: Not on file   Highest education level: Not on file  Occupational History   Not on file  Tobacco Use   Smoking status: Some Days   Smokeless tobacco: Never  Substance and Sexual Activity   Alcohol use: Yes    Comment: social   Drug use: Never   Sexual activity: Yes  Other Topics Concern   Not on file  Social History Narrative   Not on file   Social Drivers of Health   Financial Resource Strain: Not on file  Food Insecurity: Not on file  Transportation Needs: Not on file  Physical Activity: Not on file  Stress: Not on file  Social Connections: Not on file   Works for the [employer] (country [place]) Formerly a engineer, 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 90 tablet 3   Na Sulfate-K Sulfate-Mg Sulfate concentrate (SUPREP) 17.5-3.13-1.6 GM/177ML SOLN Take 1  kit (354 mLs total) by mouth once for 1 dose. 354 mL 0   rosuvastatin  (CRESTOR ) 20 MG tablet Take 1 tablet (20 mg total) by mouth daily. 90 tablet 3   valsartan  (DIOVAN ) 160 MG tablet Take 1 tablet (160 mg total) by mouth daily. 90 tablet 3   No current facility-administered medications for this visit.      ___________________________________________________________________ Objective   Exam:  BP 136/86 (BP Location: Right Arm, Patient Position: Sitting, Cuff Size: Normal)   Pulse 77   Ht 6' (1.829 m)   Wt 190 lb 8 oz (86.4 kg)   BMI 25.84 kg/m  Wt Readings from Last 3 Encounters:  10/27/24 190 lb 8 oz (86.4 kg)  09/08/24 189 lb (85.7 kg)  06/08/24 184 lb (83.5 kg)    General: Well-appearing, normal vocal quality Eyes: sclera anicteric, no redness ENT: oral mucosa moist without lesions, no cervical or supraclavicular lymphadenopathy CV: Regular without appreciable murmur, no JVD, no peripheral edema Resp: clear to auscultation bilaterally, normal RR and effort noted GI: soft, no tenderness, with active bowel sounds. No guarding or palpable organomegaly noted. Skin; warm and dry, no rash or jaundice noted Neuro: awake, alert and oriented x 3. Normal gross motor function and fluent speech   Labs:     Latest Ref Rng & Units 09/08/2024   10:23 AM 07/29/2021    9:56 PM 07/29/2021    9:43 PM  CBC  WBC 4.0 - 10.5 K/uL 8.3   8.7   Hemoglobin 13.0 - 17.0 g/dL 85.8  86.0  86.0   Hematocrit 39.0 - 52.0 % 43.2  41.0  40.1   Platelets 150.0 - 400.0 K/uL 346.0   375       Latest Ref Rng & Units 09/08/2024   10:23 AM 07/29/2021    9:56 PM 07/29/2021    9:43 PM  CMP  Glucose 70 - 99 mg/dL 81  80  82   BUN 6 - 23 mg/dL 16  12  12    Creatinine 0.40 - 1.50 mg/dL 8.65  8.79  8.79   Sodium 135 - 145 mEq/L 138  140  136   Potassium 3.5 - 5.1 mEq/L 4.4  3.3  3.3   Chloride 96 - 112 mEq/L 105  105  104   CO2 19 - 32 mEq/L 25   23   Calcium  8.4 - 10.5 mg/dL 9.7   9.1   Total Protein  6.0 - 8.3 g/dL 7.1   7.1   Total Bilirubin 0.2 - 1.2 mg/dL 0.8   0.4   Alkaline Phos 39 - 117 U/L 50   59   AST 0 - 37 U/L 19   26   ALT 0 - 53 U/L 20   30     Encounter Diagnoses  Name Primary?   Belching Yes   Special screening for malignant neoplasms, colon     Assessment and Plan Assessment & Plan Colorectal cancer screening No prior colonoscopy or significant family history.  Discussed colorectal cancer prevalence and opportunity for early  detection and removal of polyps being crucial. - Scheduled colonoscopy for January (at his scheduled request). - Discussed bowel preparation and dietary restrictions. - Ensured accompaniment on procedure day.  Lactose intolerance Symptoms consistent with lactose intolerance. Managed with lactose-free products. - Continue using lactose-free products.  Belching and bloating Intermittent symptoms likely related to dietary habits. No alarming symptoms present. - Advised on dietary modifications, including eating slower and avoiding spicy foods. Advised him to monitor this and let us  know if this is persistent or if they are changes such as loss of appetite, dysphagia, nausea or else of concern.  No further testing of the symptoms at this point. Slow down eating, hopefully he has stopped smoking for good.  He was agreeable to a colonoscopy after discussion of procedure and risks.  The benefits and risks of the planned procedure(s) were described in detail with the patient or (when appropriate) their health care proxy.  Risks were outlined as including, but not limited to, bleeding, infection, perforation, adverse medication reaction leading to cardiac or pulmonary decompensation, pancreatitis (if ERCP).  The limitation of incomplete mucosal visualization was also discussed.  No guarantees or warranties were given.   Thank you for the courtesy of this consult.  Please call me with any questions or concerns.  Victory LITTIE Brand III  CC: Referring  provider noted above

## 2024-12-19 ENCOUNTER — Encounter: Payer: Self-pay | Admitting: Gastroenterology

## 2024-12-26 ENCOUNTER — Encounter: Payer: Self-pay | Admitting: Gastroenterology

## 2024-12-26 ENCOUNTER — Ambulatory Visit: Admitting: Gastroenterology

## 2024-12-26 VITALS — BP 152/90 | HR 62 | Temp 98.2°F | Resp 22 | Ht 72.0 in | Wt 190.0 lb

## 2024-12-26 DIAGNOSIS — D122 Benign neoplasm of ascending colon: Secondary | ICD-10-CM

## 2024-12-26 DIAGNOSIS — Z1211 Encounter for screening for malignant neoplasm of colon: Secondary | ICD-10-CM | POA: Diagnosis present

## 2024-12-26 DIAGNOSIS — D129 Benign neoplasm of anus and anal canal: Secondary | ICD-10-CM

## 2024-12-26 DIAGNOSIS — K5730 Diverticulosis of large intestine without perforation or abscess without bleeding: Secondary | ICD-10-CM

## 2024-12-26 DIAGNOSIS — D128 Benign neoplasm of rectum: Secondary | ICD-10-CM

## 2024-12-26 MED ORDER — SODIUM CHLORIDE 0.9 % IV SOLN: 500.0000 mL | Freq: Once | INTRAVENOUS | Status: DC

## 2024-12-26 NOTE — Progress Notes (Signed)
 Called to room to assist during endoscopic procedure.  Patient ID and intended procedure confirmed with present staff. Received instructions for my participation in the procedure from the performing physician.

## 2024-12-26 NOTE — Progress Notes (Unsigned)
 1537 BP 161/104, Labetalol given IV, MD update, vss

## 2024-12-26 NOTE — Progress Notes (Unsigned)
 Report given to PACU, vss

## 2024-12-26 NOTE — Op Note (Signed)
 [facility] Patient Name: Travis Trevino Procedure Date: 12/26/2024 3:19 PM MRN: [id number] Endoscopist: Victory L. Legrand , MD, [id number] Age: 69 Referring MD:  Date of Birth: 1956-05-28 Gender: Male Account #: [id number] Procedure:                Colonoscopy Indications:              Screening for colorectal malignant neoplasm, This                            is the patient's first colonoscopy Medicines:                Monitored Anesthesia Care Procedure:                Pre-Anesthesia Assessment:                           - Prior to the procedure, a History and Physical                            was performed, and patient medications and                            allergies were reviewed. The patient's tolerance of                            previous anesthesia was also reviewed. The risks                            and benefits of the procedure and the sedation                            options and risks were discussed with the patient.                            All questions were answered, and informed consent                            was obtained. Prior Anticoagulants: The patient has                            taken no anticoagulant or antiplatelet agents. ASA                            Grade Assessment: II - A patient with mild systemic                            disease. After reviewing the risks and benefits,                            the patient was deemed in satisfactory condition to                            undergo the procedure.  After obtaining informed consent, the colonoscope                            was passed under direct vision. Throughout the                            procedure, the patient's blood pressure, pulse, and                            oxygen saturations were monitored continuously. The                            Colonoscope was introduced through the anus and                            advanced to the the  cecum, identified by                            appendiceal orifice and ileocecal valve. The                            colonoscopy was performed without difficulty. The                            patient tolerated the procedure well. The quality                            of the bowel preparation was excellent. The                            ileocecal valve, appendiceal orifice, and rectum                            were photographed. Scope In: 3:30:25 PM Scope Out: 3:59:48 PM Scope Withdrawal Time: 0 hours 26 minutes 8 seconds  Total Procedure Duration: 0 hours 29 minutes 23 seconds  Findings:                 A diminutive polyp was found in the ascending                            colon. The polyp was sessile. The polyp was removed                            with a cold snare. Resection and retrieval were                            complete.                           A 20 mm polyp was found in the mid rectum (just                            proximal to the mid rectal fold, about 10 cm from  the anal verge). The polyp was carpet-like,                            multi-lobulated and sessile. Area was injected with                            10 mL saline for a lift polypectomy. Injection was                            performed but polyp would not lift. (Therefore                            polypectomy was not performed) Biopsies were taken                            more protruding polyp tissue in the center with a                            cold forceps for histology. Area was tattooed with                            an injection of 2 mL of Spot - two 0.5 ml                            injections on opposite walls about 5cm proximal and                            distal to the lesion.                           Multiple diverticula were found in the left colon.                           The exam was otherwise without abnormality on                             direct and retroflexion views. Complications:            No immediate complications. Estimated Blood Loss:     Estimated blood loss was minimal. Impression:               - One diminutive polyp in the ascending colon,                            removed with a cold snare. Resected and retrieved.                           - One 20 mm polyp in the mid rectum. Saline lift                            not successful, thus polypectomy not performed.  Biopsied. Tattooed.                           - Diverticulosis in the left colon.                           - The examination was otherwise normal on direct                            and retroflexion views. Recommendation:           - Patient has a contact number available for                            emergencies. The signs and symptoms of potential                            delayed complications were discussed with the                            patient. Return to normal activities tomorrow.                            Written discharge instructions were provided to the                            patient.                           - Resume previous diet.                           - Continue present medications.                           - Await pathology results. If not malignant, repeat                            colonoscopy in hospital outpatient endoscopy                            department for EMR of rectal polyp with polymer                            lifting agent. [name] L. Legrand, MD 12/26/2024 4:10:41 PM This report has been signed electronically.

## 2024-12-26 NOTE — Patient Instructions (Addendum)
 Resume previous diet Continue present medications Await pathology results  Handouts/information given for polyps, diverticulosis  YOU HAD AN ENDOSCOPIC PROCEDURE TODAY AT THE [facility]:   Refer to the procedure report that was given to you for any specific questions about what was found during the examination.  If the procedure report does not answer your questions, please call your gastroenterologist to clarify.  If you requested that your care partner not be given the details of your procedure findings, then the procedure report has been included in a sealed envelope for you to review at your convenience later.  YOU SHOULD EXPECT: Some feelings of bloating in the abdomen. Passage of more gas than usual.  Walking can help get rid of the air that was put into your GI tract during the procedure and reduce the bloating. If you had a lower endoscopy (such as a colonoscopy or flexible sigmoidoscopy) you may notice spotting of blood in your stool or on the toilet paper. If you underwent a bowel prep for your procedure, you may not have a normal bowel movement for a few days.  Please Note:  You might notice some irritation and congestion in your nose or some drainage.  This is from the oxygen used during your procedure.  There is no need for concern and it should clear up in a day or so.  SYMPTOMS TO REPORT IMMEDIATELY:  Following lower endoscopy (colonoscopy):  Excessive amounts of blood in the stool  Significant tenderness or worsening of abdominal pains  Swelling of the abdomen that is new, acute  Fever of 100F or higher  For urgent or emergent issues, a gastroenterologist can be reached at any hour by calling [phone]. Do not use MyChart messaging for urgent concerns.    DIET:  We do recommend a small meal at first, but then you may proceed to your regular diet.  Drink plenty of fluids but you should avoid alcoholic beverages for 24 hours.  ACTIVITY:  You should plan to  take it easy for the rest of today and you should NOT DRIVE or use heavy machinery until tomorrow (because of the sedation medicines used during the test).    FOLLOW UP: Our staff will call the number listed on your records the next business day following your procedure.  We will call around 7:15- 8:00 am to check on you and address any questions or concerns that you may have regarding the information given to you following your procedure. If we do not reach you, we will leave a message.     If any biopsies were taken you will be contacted by phone or by letter within the next 1-3 weeks.  Please call us  at [phone] if you have not heard about the biopsies in 3 weeks.    SIGNATURES/CONFIDENTIALITY: You and/or your care partner have signed paperwork which will be entered into your electronic medical record.  These signatures attest to the fact that that the information above on your After Visit Summary has been reviewed and is understood.  Full responsibility of the confidentiality of this discharge information lies with you and/or your care-partner.

## 2024-12-26 NOTE — Progress Notes (Unsigned)
 History and Physical:  This patient presents for endoscopic testing for: Encounter Diagnosis  Name Primary?   Special screening for malignant neoplasms, colon Yes    Average risk for colorectal cancer.  1st screening exam.  Patient was also seen 10/27/24 in office for belching and benign UGI symptoms  Patient is otherwise without complaints or active issues today.   Past Medical History: Past Medical History:  Diagnosis Date   Arthritis    Heart murmur    HTN (hypertension)    Hyperlipemia      Past Surgical History: Past Surgical History:  Procedure Laterality Date   COLONOSCOPY     KNEE ARTHROSCOPY W/ MENISCAL REPAIR Left    06/2016    Allergies: Allergies[1]  Outpatient Meds: Current Outpatient Medications  Medication Sig Dispense Refill   amLODipine  (NORVASC ) 5 MG tablet Take 1 tablet (5 mg total) by mouth daily. 90 tablet 3   rosuvastatin  (CRESTOR ) 20 MG tablet Take 1 tablet (20 mg total) by mouth daily. 90 tablet 3   valsartan  (DIOVAN ) 160 MG tablet Take 1 tablet (160 mg total) by mouth daily. 90 tablet 3   Current Facility-Administered Medications  Medication Dose Route Frequency Provider Last Rate Last Admin   0.9 %  sodium chloride  infusion  500 mL Intravenous Once Danis, Victory LITTIE MOULD, MD          ___________________________________________________________________ Objective   Exam:  BP (!) 172/91   Pulse (!) 56   Temp 98.2 F (36.8 C) (Temporal)   Ht 6' (1.829 m)   Wt 190 lb (86.2 kg)   SpO2 99%   BMI 25.77 kg/m   CV: regular , S1/S2 Resp: clear to auscultation bilaterally, normal RR and effort noted GI: soft, no tenderness, with active bowel sounds.   Assessment: Encounter Diagnosis  Name Primary?   Special screening for malignant neoplasms, colon Yes     Plan: Colonoscopy  The benefits and risks of the planned procedure(s) were described in detail with the patient or (when appropriate) their health care proxy.  Risks were  outlined as including, but not limited to, bleeding, infection, perforation, adverse medication reaction leading to cardiac or pulmonary decompensation, pancreatitis (if ERCP).  The limitation of incomplete mucosal visualization was also discussed.  No guarantees or warranties were given.  The patient was provided an opportunity to ask questions and all were answered. The patient agreed with the plan.   The patient is appropriate for an endoscopic procedure in the ambulatory setting.   - Victory Brand, MD        [1] No Known Allergies

## 2024-12-26 NOTE — Progress Notes (Unsigned)
 1530 BP 184/116, Labetalol given IV, MD update, vss

## 2024-12-27 ENCOUNTER — Telehealth: Payer: Self-pay | Admitting: *Deleted

## 2024-12-27 NOTE — Telephone Encounter (Signed)
 No answer for follow up call. Left a message.

## 2024-12-29 LAB — SURGICAL PATHOLOGY

## 2025-01-03 ENCOUNTER — Ambulatory Visit: Payer: Self-pay | Admitting: Gastroenterology

## 2025-01-03 ENCOUNTER — Other Ambulatory Visit: Payer: Self-pay

## 2025-01-03 DIAGNOSIS — Z860100 Personal history of colon polyps, unspecified: Secondary | ICD-10-CM

## 2025-01-03 MED ORDER — NA SULFATE-K SULFATE-MG SULF 17.5-3.13-1.6 GM/177ML PO SOLN: ORAL | 0 refills | Status: AC

## 2025-01-04 ENCOUNTER — Ambulatory Visit: Admitting: Emergency Medicine

## 2025-01-04 ENCOUNTER — Encounter: Payer: Self-pay | Admitting: Emergency Medicine

## 2025-01-04 VITALS — BP 146/100 | HR 78 | Temp 97.7°F | Ht 72.0 in | Wt 192.6 lb

## 2025-01-04 DIAGNOSIS — J22 Unspecified acute lower respiratory infection: Secondary | ICD-10-CM | POA: Diagnosis not present

## 2025-01-04 DIAGNOSIS — R053 Chronic cough: Secondary | ICD-10-CM | POA: Diagnosis not present

## 2025-01-04 MED ORDER — AMOXICILLIN-POT CLAVULANATE 875-125 MG PO TABS: 1.0000 | ORAL_TABLET | Freq: Two times a day (BID) | ORAL | 0 refills | Status: AC

## 2025-01-04 NOTE — Assessment & Plan Note (Signed)
 Upper viral respiratory infection now with secondary bacterial infection Chronic smoker.  No smoking for the last 4 weeks Recommend course of Augmentin  875 mg twice a day for at least 7 days Clinically stable.  No findings of pneumonia. Advised to rest and stay well-hydrated Symptom management discussed Advised to contact the office if no better or worse during the next several days

## 2025-01-04 NOTE — Patient Instructions (Signed)
 Acute Bronchitis, Adult  Acute bronchitis is when air tubes in the lungs (bronchi) suddenly get swollen. The condition can make it hard for you to breathe. In adults, acute bronchitis usually goes away within 2 weeks. A cough caused by bronchitis may last up to 3 weeks. Smoking, allergies, and asthma can make the condition worse. What are the causes? Germs that cause cold and flu (viruses). The most common cause of this condition is the virus that causes the common cold. Bacteria. Substances that bother (irritate) the lungs, including: Smoke from cigarettes and other types of tobacco. Dust and pollen. Fumes from chemicals, gases, or burned fuel. Indoor or outdoor air pollution. What increases the risk? A weak body's defense system. This is also called the immune system. Any condition that affects your lungs and breathing, such as asthma. What are the signs or symptoms? A cough. Coughing up clear, yellow, or green mucus. Making high-pitched whistling sounds when you breathe, most often when you breathe out (wheezing). Runny or stuffy nose. Having too much mucus in your lungs (chest congestion). Shortness of breath. Body aches. A sore throat. How is this treated? Acute bronchitis may go away over time without treatment. Your doctor may tell you to: Drink more fluids. This will help thin your mucus so it is easier to cough up. Use a device that gets medicine into your lungs (inhaler). Use a vaporizer or a humidifier. These are machines that add water to the air. This helps with coughing and poor breathing. Take a medicine that thins mucus and helps clear it from your lungs. Take a medicine that prevents or stops coughing. It is not common to take an antibiotic medicine for this condition. Follow these instructions at home:  Take over-the-counter and prescription medicines only as told by your doctor. Use an inhaler, vaporizer, or humidifier as told by your doctor. Take two teaspoons  (10 mL) of honey at bedtime. This helps lessen your coughing at night. Drink enough fluid to keep your pee (urine) pale yellow. Do not smoke or use any products that contain nicotine or tobacco. If you need help quitting, ask your doctor. Get a lot of rest. Return to your normal activities when your doctor says that it is safe. Keep all follow-up visits. How is this prevented?  Wash your hands often with soap and water for at least 20 seconds. If you cannot use soap and water, use hand sanitizer. Avoid contact with people who have cold symptoms. Try not to touch your mouth, nose, or eyes with your hands. Avoid breathing in smoke or chemical fumes. Make sure to get the flu shot every year. Contact a doctor if: Your symptoms do not get better in 2 weeks. You have trouble coughing up the mucus. Your cough keeps you awake at night. You have a fever. Get help right away if: You cough up blood. You have chest pain. You have very bad shortness of breath. You faint or keep feeling like you are going to faint. You have a very bad headache. Your fever or chills get worse. These symptoms may be an emergency. Get help right away. Call your local emergency services (911 in the [place] Do not wait to see if the symptoms will go away. Do not drive yourself to the hospital. Summary Acute bronchitis is when air tubes in the lungs (bronchi) suddenly get swollen. In adults, acute bronchitis usually goes away within 2 weeks. Drink more fluids. This will help thin your mucus so it is easier  to cough up. Take over-the-counter and prescription medicines only as told by your doctor. Contact a doctor if your symptoms do not improve after 2 weeks of treatment. This information is not intended to replace advice given to you by your health care provider. Make sure you discuss any questions you have with your health care provider. Document Revised: 04/03/2021 Document Reviewed: 04/03/2021 Elsevier Patient  Education  2024 [employer].

## 2025-01-04 NOTE — Progress Notes (Signed)
 Travis Trevino 69 y.o.   Chief Complaint  Patient presents with   Cough    Cough, sneezing, fatigue    HISTORY OF PRESENT ILLNESS: This is a 69 y.o. male complaining of persistent cough for the last 4 weeks Flulike symptoms as well.  Complaining of coughing, sneezing, fatigue and chest congestion Symptoms not getting better Amoxicillin  has helped in the past Chronic smoker but no smoking for the past 4 weeks No other complaints or medical concerns today.  Cough Pertinent negatives include no chest pain, chills, fever, headaches, hemoptysis, sore throat, shortness of breath or wheezing.     Prior to Admission medications  Medication Sig Start Date End Date Taking? Authorizing Provider  amLODipine  (NORVASC ) 5 MG tablet Take 1 tablet (5 mg total) by mouth daily. 09/08/24 01/04/25 Yes Kolby Myung Jose, MD  amoxicillin -clavulanate (AUGMENTIN ) 875-125 MG tablet Take 1 tablet by mouth 2 (two) times daily for 10 days. 01/04/25 01/14/25 Yes Jefferson Fullam, Emil Schanz, MD  Na Sulfate-K Sulfate-Mg Sulfate concentrate (SUPREP) 17.5-3.13-1.6 GM/177ML SOLN Follow MD instructions 01/03/25  Yes Danis, Victory LITTIE MOULD, MD  rosuvastatin  (CRESTOR ) 20 MG tablet Take 1 tablet (20 mg total) by mouth daily. 09/08/24  Yes Kailia Starry, Emil Schanz, MD  valsartan  (DIOVAN ) 160 MG tablet Take 1 tablet (160 mg total) by mouth daily. 09/08/24  Yes Purcell Emil Schanz, MD    Allergies[1]  Patient Active Problem List   Diagnosis Date Noted   Lower respiratory infection 01/04/2025   Persistent cough for 3 weeks or longer 01/04/2025   Current smoker 08/05/2023   History of bilateral hip replacements 05/18/2023   Prediabetes 12/22/2019   Dyslipidemia 12/22/2019   Essential hypertension 06/30/2018   RLS (restless legs syndrome) 11/15/2016    Past Medical History:  Diagnosis Date   Arthritis    Heart murmur    HTN (hypertension)    Hyperlipemia     Past Surgical History:  Procedure Laterality Date    COLONOSCOPY     KNEE ARTHROSCOPY W/ MENISCAL REPAIR Left    06/2016    Social History   Socioeconomic History   Marital status: Married    Spouse name: Not on file   Number of children: Not on file   Years of education: Not on file   Highest education level: Not on file  Occupational History   Not on file  Tobacco Use   Smoking status: Some Days    Types: Cigarettes   Smokeless tobacco: Never  Vaping Use   Vaping status: Never Used  Substance and Sexual Activity   Alcohol use: Yes    Comment: social   Drug use: Never   Sexual activity: Yes  Other Topics Concern   Not on file  Social History Narrative   Not on file   Social Drivers of Health   Tobacco Use: High Risk (01/04/2025)   Patient History    Smoking Tobacco Use: Some Days    Smokeless Tobacco Use: Never    Passive Exposure: Not on file  Financial Resource Strain: Not on file  Food Insecurity: Not on file  Transportation Needs: Not on file  Physical Activity: Not on file  Stress: Not on file  Social Connections: Not on file  Intimate Partner Violence: Not on file  Depression (PHQ2-9): Low Risk (06/08/2024)   Depression (PHQ2-9)    PHQ-2 Score: 0  Alcohol Screen: Not on file  Housing: Not on file  Utilities: Not on file  Health Literacy: Not on file  Family History  Problem Relation Age of Onset   Cancer Maternal Aunt    Colon cancer Neg Hx    Esophageal cancer Neg Hx    Rectal cancer Neg Hx    Stomach cancer Neg Hx      Review of Systems  Constitutional: Negative.  Negative for chills and fever.  HENT:  Positive for congestion. Negative for sore throat.   Respiratory:  Positive for cough and sputum production. Negative for hemoptysis, shortness of breath and wheezing.   Cardiovascular: Negative.  Negative for chest pain and palpitations.  Gastrointestinal:  Negative for abdominal pain, diarrhea, nausea and vomiting.  Genitourinary: Negative.  Negative for dysuria and hematuria.   Neurological: Negative.  Negative for dizziness and headaches.  All other systems reviewed and are negative.   Vitals:   01/04/25 0942  BP: (!) 146/100  Pulse: 78  Temp: 97.7 F (36.5 C)  SpO2: 99%    Physical Exam Vitals reviewed.  Constitutional:      Appearance: Normal appearance.  HENT:     Head: Normocephalic.     Mouth/Throat:     Mouth: Mucous membranes are moist.     Pharynx: Oropharynx is clear.  Eyes:     Extraocular Movements: Extraocular movements intact.     Pupils: Pupils are equal, round, and reactive to light.  Cardiovascular:     Rate and Rhythm: Normal rate and regular rhythm.     Pulses: Normal pulses.     Heart sounds: Normal heart sounds.  Pulmonary:     Effort: Pulmonary effort is normal.     Breath sounds: Normal breath sounds.  Abdominal:     Palpations: Abdomen is soft.     Tenderness: There is no abdominal tenderness.  Musculoskeletal:     Cervical back: No tenderness.  Lymphadenopathy:     Cervical: No cervical adenopathy.  Skin:    General: Skin is warm and dry.     Capillary Refill: Capillary refill takes less than 2 seconds.  Neurological:     General: No focal deficit present.     Mental Status: He is alert and oriented to person, place, and time.  Psychiatric:        Mood and Affect: Mood normal.        Behavior: Behavior normal.      ASSESSMENT & PLAN: Problem List Items Addressed This Visit       Respiratory   Lower respiratory infection - Primary   Upper viral respiratory infection now with secondary bacterial infection Chronic smoker.  No smoking for the last 4 weeks Recommend course of Augmentin  875 mg twice a day for at least 7 days Clinically stable.  No findings of pneumonia. Advised to rest and stay well-hydrated Symptom management discussed Advised to contact the office if no better or worse during the next several days      Relevant Medications   amoxicillin -clavulanate (AUGMENTIN ) 875-125 MG tablet      Other   Persistent cough for 3 weeks or longer   Cough management discussed. Continue over-the-counter Mucinex DM and cough drops Stay well-hydrated Recommend NyQuil at nighttime      Patient Instructions  Acute Bronchitis, Adult  Acute bronchitis is when air tubes in the lungs (bronchi) suddenly get swollen. The condition can make it hard for you to breathe. In adults, acute bronchitis usually goes away within 2 weeks. A cough caused by bronchitis may last up to 3 weeks. Smoking, allergies, and asthma can make the condition worse.  What are the causes? Germs that cause cold and flu (viruses). The most common cause of this condition is the virus that causes the common cold. Bacteria. Substances that bother (irritate) the lungs, including: Smoke from cigarettes and other types of tobacco. Dust and pollen. Fumes from chemicals, gases, or burned fuel. Indoor or outdoor air pollution. What increases the risk? A weak body's defense system. This is also called the immune system. Any condition that affects your lungs and breathing, such as asthma. What are the signs or symptoms? A cough. Coughing up clear, yellow, or green mucus. Making high-pitched whistling sounds when you breathe, most often when you breathe out (wheezing). Runny or stuffy nose. Having too much mucus in your lungs (chest congestion). Shortness of breath. Body aches. A sore throat. How is this treated? Acute bronchitis may go away over time without treatment. Your doctor may tell you to: Drink more fluids. This will help thin your mucus so it is easier to cough up. Use a device that gets medicine into your lungs (inhaler). Use a vaporizer or a humidifier. These are machines that add water to the air. This helps with coughing and poor breathing. Take a medicine that thins mucus and helps clear it from your lungs. Take a medicine that prevents or stops coughing. It is not common to take an antibiotic medicine for this  condition. Follow these instructions at home:  Take over-the-counter and prescription medicines only as told by your doctor. Use an inhaler, vaporizer, or humidifier as told by your doctor. Take two teaspoons (10 mL) of honey at bedtime. This helps lessen your coughing at night. Drink enough fluid to keep your pee (urine) pale yellow. Do not smoke or use any products that contain nicotine or tobacco. If you need help quitting, ask your doctor. Get a lot of rest. Return to your normal activities when your doctor says that it is safe. Keep all follow-up visits. How is this prevented?  Wash your hands often with soap and water for at least 20 seconds. If you cannot use soap and water, use hand sanitizer. Avoid contact with people who have cold symptoms. Try not to touch your mouth, nose, or eyes with your hands. Avoid breathing in smoke or chemical fumes. Make sure to get the flu shot every year. Contact a doctor if: Your symptoms do not get better in 2 weeks. You have trouble coughing up the mucus. Your cough keeps you awake at night. You have a fever. Get help right away if: You cough up blood. You have chest pain. You have very bad shortness of breath. You faint or keep feeling like you are going to faint. You have a very bad headache. Your fever or chills get worse. These symptoms may be an emergency. Get help right away. Call your local emergency services (911 in the [place] Do not wait to see if the symptoms will go away. Do not drive yourself to the hospital. Summary Acute bronchitis is when air tubes in the lungs (bronchi) suddenly get swollen. In adults, acute bronchitis usually goes away within 2 weeks. Drink more fluids. This will help thin your mucus so it is easier to cough up. Take over-the-counter and prescription medicines only as told by your doctor. Contact a doctor if your symptoms do not improve after 2 weeks of treatment. This information is not intended to  replace advice given to you by your health care provider. Make sure you discuss any questions you have with your health care  provider. Document Revised: 04/03/2021 Document Reviewed: 04/03/2021 Elsevier Patient Education  2024 [employer]     Emil Schaumann, MD [facility]    [1] No Known Allergies

## 2025-01-04 NOTE — Assessment & Plan Note (Signed)
 Cough management discussed. Continue over-the-counter Mucinex DM and cough drops Stay well-hydrated Recommend NyQuil at nighttime

## 2025-02-09 ENCOUNTER — Encounter (HOSPITAL_COMMUNITY): Payer: Self-pay

## 2025-02-09 ENCOUNTER — Ambulatory Visit (HOSPITAL_COMMUNITY): Admit: 2025-02-09 | Admitting: Gastroenterology

## 2025-02-09 SURGERY — COLONOSCOPY
Anesthesia: Monitor Anesthesia Care
